# Patient Record
Sex: Female | Born: 1935 | Race: White | Hispanic: No | Marital: Single
Health system: Southern US, Community
[De-identification: ages and names within clinical notes are randomized; demographics above are authoritative.]

---

## 1998-05-10 ENCOUNTER — Encounter
Admission: RE | Admit: 1998-05-10 | Discharge: 1998-05-16 | Payer: Self-pay | Admitting: Thoracic Surgery (Cardiothoracic Vascular Surgery)

## 1998-05-16 ENCOUNTER — Inpatient Hospital Stay (HOSPITAL_COMMUNITY)
Admission: RE | Admit: 1998-05-16 | Discharge: 1998-05-25 | Payer: Self-pay | Admitting: Thoracic Surgery (Cardiothoracic Vascular Surgery)

## 1998-05-29 ENCOUNTER — Encounter: Admission: RE | Admit: 1998-05-29 | Discharge: 1998-08-27 | Payer: Self-pay | Admitting: Internal Medicine

## 1998-07-12 ENCOUNTER — Inpatient Hospital Stay (HOSPITAL_COMMUNITY): Admission: EM | Admit: 1998-07-12 | Discharge: 1998-07-21 | Payer: Self-pay | Admitting: Emergency Medicine

## 1998-07-21 ENCOUNTER — Inpatient Hospital Stay: Admission: RE | Admit: 1998-07-21 | Discharge: 1998-08-07 | Payer: Self-pay | Admitting: Infectious Diseases

## 1998-08-30 ENCOUNTER — Inpatient Hospital Stay (HOSPITAL_COMMUNITY)
Admission: EM | Admit: 1998-08-30 | Discharge: 1998-09-04 | Payer: Self-pay | Admitting: Thoracic Surgery (Cardiothoracic Vascular Surgery)

## 1998-12-11 ENCOUNTER — Ambulatory Visit (HOSPITAL_COMMUNITY)
Admission: RE | Admit: 1998-12-11 | Discharge: 1998-12-11 | Payer: Self-pay | Admitting: Thoracic Surgery (Cardiothoracic Vascular Surgery)

## 1998-12-11 ENCOUNTER — Encounter: Payer: Self-pay | Admitting: Thoracic Surgery (Cardiothoracic Vascular Surgery)

## 1999-04-19 ENCOUNTER — Encounter: Payer: Self-pay | Admitting: Orthopedic Surgery

## 1999-04-19 ENCOUNTER — Ambulatory Visit (HOSPITAL_COMMUNITY): Admission: RE | Admit: 1999-04-19 | Discharge: 1999-04-19 | Payer: Self-pay | Admitting: Orthopedic Surgery

## 1999-06-18 ENCOUNTER — Inpatient Hospital Stay (HOSPITAL_COMMUNITY): Admission: EM | Admit: 1999-06-18 | Discharge: 1999-06-21 | Payer: Self-pay | Admitting: Emergency Medicine

## 1999-06-18 ENCOUNTER — Encounter: Payer: Self-pay | Admitting: Emergency Medicine

## 1999-06-21 ENCOUNTER — Encounter: Payer: Self-pay | Admitting: Internal Medicine

## 1999-07-10 ENCOUNTER — Ambulatory Visit (HOSPITAL_COMMUNITY): Admission: RE | Admit: 1999-07-10 | Discharge: 1999-07-10 | Payer: Self-pay | Admitting: Endocrinology

## 1999-07-10 ENCOUNTER — Encounter: Payer: Self-pay | Admitting: Endocrinology

## 2000-02-11 ENCOUNTER — Ambulatory Visit (HOSPITAL_COMMUNITY): Admission: RE | Admit: 2000-02-11 | Discharge: 2000-02-11 | Payer: Self-pay | Admitting: Endocrinology

## 2000-02-11 ENCOUNTER — Encounter: Payer: Self-pay | Admitting: Endocrinology

## 2000-03-26 ENCOUNTER — Encounter: Payer: Self-pay | Admitting: Internal Medicine

## 2000-03-26 ENCOUNTER — Ambulatory Visit (HOSPITAL_COMMUNITY): Admission: RE | Admit: 2000-03-26 | Discharge: 2000-03-26 | Payer: Self-pay | Admitting: Internal Medicine

## 2000-05-22 ENCOUNTER — Encounter: Payer: Self-pay | Admitting: Endocrinology

## 2000-05-22 ENCOUNTER — Ambulatory Visit (HOSPITAL_COMMUNITY): Admission: RE | Admit: 2000-05-22 | Discharge: 2000-05-22 | Payer: Self-pay | Admitting: Endocrinology

## 2000-10-02 ENCOUNTER — Encounter: Payer: Self-pay | Admitting: Endocrinology

## 2000-10-02 ENCOUNTER — Ambulatory Visit (HOSPITAL_COMMUNITY): Admission: RE | Admit: 2000-10-02 | Discharge: 2000-10-02 | Payer: Self-pay | Admitting: Endocrinology

## 2001-04-09 ENCOUNTER — Emergency Department (HOSPITAL_COMMUNITY): Admission: EM | Admit: 2001-04-09 | Discharge: 2001-04-09 | Payer: Self-pay

## 2002-01-22 ENCOUNTER — Other Ambulatory Visit: Admission: RE | Admit: 2002-01-22 | Discharge: 2002-01-22 | Payer: Self-pay | Admitting: Obstetrics and Gynecology

## 2002-03-04 ENCOUNTER — Ambulatory Visit (HOSPITAL_COMMUNITY): Admission: RE | Admit: 2002-03-04 | Discharge: 2002-03-04 | Payer: Self-pay | Admitting: Obstetrics and Gynecology

## 2002-03-04 ENCOUNTER — Encounter (INDEPENDENT_AMBULATORY_CARE_PROVIDER_SITE_OTHER): Payer: Self-pay

## 2002-08-04 ENCOUNTER — Encounter (INDEPENDENT_AMBULATORY_CARE_PROVIDER_SITE_OTHER): Payer: Self-pay | Admitting: *Deleted

## 2002-08-04 ENCOUNTER — Ambulatory Visit (HOSPITAL_COMMUNITY): Admission: RE | Admit: 2002-08-04 | Discharge: 2002-08-04 | Payer: Self-pay | Admitting: Gastroenterology

## 2003-01-21 ENCOUNTER — Encounter: Admission: RE | Admit: 2003-01-21 | Discharge: 2003-01-21 | Payer: Self-pay | Admitting: Endocrinology

## 2003-01-21 ENCOUNTER — Encounter: Payer: Self-pay | Admitting: Endocrinology

## 2003-03-03 ENCOUNTER — Encounter: Admission: RE | Admit: 2003-03-03 | Discharge: 2003-03-03 | Payer: Self-pay | Admitting: Endocrinology

## 2003-03-03 ENCOUNTER — Encounter: Payer: Self-pay | Admitting: Endocrinology

## 2003-04-22 ENCOUNTER — Ambulatory Visit (HOSPITAL_COMMUNITY): Admission: RE | Admit: 2003-04-22 | Discharge: 2003-04-22 | Payer: Self-pay | Admitting: Endocrinology

## 2003-04-22 ENCOUNTER — Encounter: Payer: Self-pay | Admitting: Endocrinology

## 2003-04-22 ENCOUNTER — Encounter: Admission: RE | Admit: 2003-04-22 | Discharge: 2003-04-22 | Payer: Self-pay | Admitting: Endocrinology

## 2003-07-28 ENCOUNTER — Encounter: Payer: Self-pay | Admitting: Internal Medicine

## 2003-07-28 ENCOUNTER — Ambulatory Visit (HOSPITAL_COMMUNITY): Admission: RE | Admit: 2003-07-28 | Discharge: 2003-07-28 | Payer: Self-pay | Admitting: Internal Medicine

## 2003-10-31 ENCOUNTER — Inpatient Hospital Stay (HOSPITAL_COMMUNITY): Admission: AD | Admit: 2003-10-31 | Discharge: 2003-11-02 | Payer: Self-pay | Admitting: Endocrinology

## 2004-01-09 ENCOUNTER — Ambulatory Visit (HOSPITAL_COMMUNITY): Admission: RE | Admit: 2004-01-09 | Discharge: 2004-01-09 | Payer: Self-pay | Admitting: Gastroenterology

## 2004-02-28 ENCOUNTER — Other Ambulatory Visit: Admission: RE | Admit: 2004-02-28 | Discharge: 2004-02-28 | Payer: Self-pay | Admitting: Obstetrics and Gynecology

## 2004-10-23 ENCOUNTER — Ambulatory Visit: Payer: Self-pay | Admitting: Endocrinology

## 2005-01-14 ENCOUNTER — Ambulatory Visit: Payer: Self-pay | Admitting: Endocrinology

## 2005-01-22 ENCOUNTER — Ambulatory Visit (HOSPITAL_COMMUNITY): Admission: RE | Admit: 2005-01-22 | Discharge: 2005-01-22 | Payer: Self-pay | Admitting: Endocrinology

## 2005-02-19 ENCOUNTER — Ambulatory Visit: Payer: Self-pay | Admitting: Endocrinology

## 2005-04-03 ENCOUNTER — Ambulatory Visit: Payer: Self-pay | Admitting: Endocrinology

## 2005-04-12 ENCOUNTER — Ambulatory Visit: Payer: Self-pay | Admitting: Endocrinology

## 2005-04-15 ENCOUNTER — Ambulatory Visit: Payer: Self-pay | Admitting: Endocrinology

## 2005-04-19 ENCOUNTER — Ambulatory Visit: Payer: Self-pay | Admitting: Internal Medicine

## 2005-04-26 ENCOUNTER — Ambulatory Visit: Payer: Self-pay | Admitting: Endocrinology

## 2005-04-26 ENCOUNTER — Ambulatory Visit (HOSPITAL_COMMUNITY): Admission: RE | Admit: 2005-04-26 | Discharge: 2005-04-26 | Payer: Self-pay | Admitting: Endocrinology

## 2005-04-30 ENCOUNTER — Ambulatory Visit: Payer: Self-pay | Admitting: Endocrinology

## 2005-05-15 ENCOUNTER — Ambulatory Visit: Payer: Self-pay | Admitting: Endocrinology

## 2005-06-10 ENCOUNTER — Ambulatory Visit: Payer: Self-pay | Admitting: Endocrinology

## 2005-06-21 ENCOUNTER — Encounter: Admission: RE | Admit: 2005-06-21 | Discharge: 2005-06-21 | Payer: Self-pay | Admitting: Orthopedic Surgery

## 2005-07-03 ENCOUNTER — Ambulatory Visit: Payer: Self-pay | Admitting: Endocrinology

## 2005-07-10 ENCOUNTER — Ambulatory Visit: Payer: Self-pay | Admitting: Endocrinology

## 2005-07-16 ENCOUNTER — Ambulatory Visit (HOSPITAL_COMMUNITY): Admission: RE | Admit: 2005-07-16 | Discharge: 2005-07-16 | Payer: Self-pay | Admitting: Endocrinology

## 2005-07-24 ENCOUNTER — Ambulatory Visit: Payer: Self-pay | Admitting: Internal Medicine

## 2005-09-03 ENCOUNTER — Ambulatory Visit: Payer: Self-pay | Admitting: Internal Medicine

## 2005-11-27 ENCOUNTER — Ambulatory Visit: Payer: Self-pay | Admitting: Internal Medicine

## 2006-02-20 ENCOUNTER — Ambulatory Visit: Payer: Self-pay | Admitting: Internal Medicine

## 2006-03-20 ENCOUNTER — Ambulatory Visit: Payer: Self-pay | Admitting: Internal Medicine

## 2006-03-26 ENCOUNTER — Ambulatory Visit: Payer: Self-pay | Admitting: Endocrinology

## 2006-04-02 ENCOUNTER — Ambulatory Visit: Payer: Self-pay | Admitting: Endocrinology

## 2006-04-15 ENCOUNTER — Ambulatory Visit: Payer: Self-pay | Admitting: Internal Medicine

## 2006-05-06 ENCOUNTER — Ambulatory Visit: Payer: Self-pay | Admitting: Endocrinology

## 2006-05-07 ENCOUNTER — Ambulatory Visit: Payer: Self-pay | Admitting: Endocrinology

## 2006-06-06 ENCOUNTER — Encounter: Payer: Self-pay | Admitting: Cardiology

## 2006-06-06 ENCOUNTER — Ambulatory Visit: Payer: Self-pay

## 2006-06-20 ENCOUNTER — Ambulatory Visit: Payer: Self-pay | Admitting: Internal Medicine

## 2006-07-02 ENCOUNTER — Ambulatory Visit: Payer: Self-pay | Admitting: Endocrinology

## 2006-07-18 ENCOUNTER — Inpatient Hospital Stay (HOSPITAL_COMMUNITY): Admission: EM | Admit: 2006-07-18 | Discharge: 2006-08-05 | Payer: Self-pay | Admitting: Emergency Medicine

## 2006-07-18 ENCOUNTER — Ambulatory Visit: Payer: Self-pay | Admitting: Internal Medicine

## 2006-07-18 ENCOUNTER — Encounter: Payer: Self-pay | Admitting: Internal Medicine

## 2006-08-09 ENCOUNTER — Inpatient Hospital Stay (HOSPITAL_COMMUNITY): Admission: EM | Admit: 2006-08-09 | Discharge: 2006-08-16 | Payer: Self-pay | Admitting: Emergency Medicine

## 2006-08-09 ENCOUNTER — Ambulatory Visit: Payer: Self-pay | Admitting: Internal Medicine

## 2006-08-26 ENCOUNTER — Ambulatory Visit: Payer: Self-pay | Admitting: Cardiovascular Disease

## 2006-09-08 ENCOUNTER — Ambulatory Visit: Payer: Self-pay | Admitting: Cardiology

## 2006-09-23 ENCOUNTER — Ambulatory Visit: Payer: Self-pay | Admitting: Cardiology

## 2006-09-24 ENCOUNTER — Ambulatory Visit: Payer: Self-pay | Admitting: Endocrinology

## 2006-10-01 ENCOUNTER — Ambulatory Visit: Payer: Self-pay | Admitting: Endocrinology

## 2006-10-23 ENCOUNTER — Ambulatory Visit: Payer: Self-pay | Admitting: Cardiology

## 2006-11-25 ENCOUNTER — Ambulatory Visit: Payer: Self-pay | Admitting: Cardiology

## 2006-12-29 ENCOUNTER — Ambulatory Visit: Payer: Self-pay | Admitting: Cardiology

## 2006-12-30 ENCOUNTER — Ambulatory Visit: Payer: Self-pay | Admitting: Endocrinology

## 2006-12-30 ENCOUNTER — Ambulatory Visit (HOSPITAL_COMMUNITY): Admission: RE | Admit: 2006-12-30 | Discharge: 2006-12-30 | Payer: Self-pay | Admitting: Endocrinology

## 2007-01-09 ENCOUNTER — Ambulatory Visit: Payer: Self-pay | Admitting: Endocrinology

## 2007-01-09 LAB — CONVERTED CEMR LAB: Calcium, Total (PTH): 8.9 mg/dL (ref 8.4–10.5)

## 2007-01-14 ENCOUNTER — Ambulatory Visit: Payer: Self-pay | Admitting: Endocrinology

## 2007-01-20 ENCOUNTER — Ambulatory Visit: Payer: Self-pay | Admitting: Internal Medicine

## 2007-02-04 ENCOUNTER — Ambulatory Visit: Payer: Self-pay | Admitting: Endocrinology

## 2007-02-11 ENCOUNTER — Ambulatory Visit: Payer: Self-pay | Admitting: Internal Medicine

## 2007-02-12 ENCOUNTER — Ambulatory Visit: Payer: Self-pay | Admitting: Endocrinology

## 2007-02-24 ENCOUNTER — Ambulatory Visit: Payer: Self-pay | Admitting: Cardiology

## 2007-03-17 ENCOUNTER — Ambulatory Visit: Payer: Self-pay | Admitting: Endocrinology

## 2007-05-06 ENCOUNTER — Ambulatory Visit: Payer: Self-pay | Admitting: Endocrinology

## 2007-05-06 LAB — CONVERTED CEMR LAB
Basophils Relative: 0.1 % (ref 0.0–1.0)
CO2: 34 meq/L — ABNORMAL HIGH (ref 19–32)
Calcium, Total (PTH): 8.5 mg/dL (ref 8.4–10.5)
Calcium: 8.7 mg/dL (ref 8.4–10.5)
Chloride: 105 meq/L (ref 96–112)
Eosinophils Relative: 4.3 % (ref 0.0–5.0)
Glucose, Bld: 93 mg/dL (ref 70–99)
HCT: 35.3 % — ABNORMAL LOW (ref 36.0–46.0)
Lymphocytes Relative: 15.8 % (ref 12.0–46.0)
Neutro Abs: 4.4 10*3/uL (ref 1.4–7.7)
Neutrophils Relative %: 66.7 % (ref 43.0–77.0)
PTH: 115.2 pg/mL — ABNORMAL HIGH (ref 14.0–72.0)
Platelets: 305 10*3/uL (ref 150–400)
RBC: 4.05 M/uL (ref 3.87–5.11)
Sodium: 141 meq/L (ref 135–145)
WBC: 6.7 10*3/uL (ref 4.5–10.5)

## 2007-05-27 ENCOUNTER — Ambulatory Visit: Payer: Self-pay | Admitting: Internal Medicine

## 2007-05-27 ENCOUNTER — Ambulatory Visit: Payer: Self-pay | Admitting: Cardiology

## 2007-05-27 ENCOUNTER — Inpatient Hospital Stay (HOSPITAL_COMMUNITY): Admission: EM | Admit: 2007-05-27 | Discharge: 2007-06-04 | Payer: Self-pay | Admitting: Orthopedic Surgery

## 2007-05-27 LAB — CONVERTED CEMR LAB
Calcium: 8.8 mg/dL (ref 8.4–10.5)
Chloride: 101 meq/L (ref 96–112)
GFR calc non Af Amer: 38 mL/min

## 2007-06-01 ENCOUNTER — Ambulatory Visit: Payer: Self-pay | Admitting: Physical Medicine & Rehabilitation

## 2007-07-14 ENCOUNTER — Encounter (HOSPITAL_COMMUNITY): Payer: Self-pay | Admitting: *Deleted

## 2007-07-14 DIAGNOSIS — I2510 Atherosclerotic heart disease of native coronary artery without angina pectoris: Secondary | ICD-10-CM | POA: Insufficient documentation

## 2007-07-14 DIAGNOSIS — I10 Essential (primary) hypertension: Secondary | ICD-10-CM | POA: Insufficient documentation

## 2007-07-14 DIAGNOSIS — E785 Hyperlipidemia, unspecified: Secondary | ICD-10-CM

## 2007-07-14 DIAGNOSIS — N259 Disorder resulting from impaired renal tubular function, unspecified: Secondary | ICD-10-CM | POA: Insufficient documentation

## 2007-08-17 ENCOUNTER — Ambulatory Visit (HOSPITAL_COMMUNITY): Admission: RE | Admit: 2007-08-17 | Discharge: 2007-08-17 | Payer: Self-pay

## 2007-08-20 ENCOUNTER — Inpatient Hospital Stay (HOSPITAL_COMMUNITY): Admission: AD | Admit: 2007-08-20 | Discharge: 2007-08-27 | Payer: Self-pay | Admitting: Endocrinology

## 2007-08-20 ENCOUNTER — Ambulatory Visit: Payer: Self-pay | Admitting: Endocrinology

## 2007-08-20 ENCOUNTER — Ambulatory Visit: Payer: Self-pay

## 2007-08-30 ENCOUNTER — Emergency Department (HOSPITAL_COMMUNITY): Admission: EM | Admit: 2007-08-30 | Discharge: 2007-08-31 | Payer: Self-pay | Admitting: Emergency Medicine

## 2007-09-02 ENCOUNTER — Ambulatory Visit: Payer: Self-pay | Admitting: Endocrinology

## 2007-09-02 ENCOUNTER — Ambulatory Visit: Payer: Self-pay | Admitting: Cardiology

## 2007-09-02 LAB — CONVERTED CEMR LAB
INR: 1.6 — ABNORMAL HIGH (ref 0.8–1.0)
Prothrombin Time: 15.8 s — ABNORMAL HIGH (ref 10.9–13.3)

## 2007-10-01 ENCOUNTER — Ambulatory Visit: Payer: Self-pay | Admitting: Endocrinology

## 2007-10-19 ENCOUNTER — Telehealth (INDEPENDENT_AMBULATORY_CARE_PROVIDER_SITE_OTHER): Payer: Self-pay | Admitting: *Deleted

## 2007-10-20 ENCOUNTER — Ambulatory Visit: Payer: Self-pay | Admitting: Endocrinology

## 2007-10-23 ENCOUNTER — Ambulatory Visit: Payer: Self-pay | Admitting: Cardiology

## 2007-10-30 ENCOUNTER — Ambulatory Visit: Payer: Self-pay | Admitting: Cardiology

## 2007-11-02 ENCOUNTER — Encounter: Payer: Self-pay | Admitting: Endocrinology

## 2007-11-04 ENCOUNTER — Emergency Department (HOSPITAL_COMMUNITY): Admission: EM | Admit: 2007-11-04 | Discharge: 2007-11-04 | Payer: Self-pay | Admitting: Emergency Medicine

## 2007-11-06 ENCOUNTER — Ambulatory Visit: Payer: Self-pay | Admitting: Cardiology

## 2007-11-17 ENCOUNTER — Ambulatory Visit: Payer: Self-pay | Admitting: Internal Medicine

## 2007-12-01 ENCOUNTER — Ambulatory Visit: Payer: Self-pay | Admitting: Cardiology

## 2007-12-14 ENCOUNTER — Ambulatory Visit: Payer: Self-pay | Admitting: Internal Medicine

## 2007-12-14 ENCOUNTER — Ambulatory Visit: Payer: Self-pay | Admitting: Cardiology

## 2007-12-14 ENCOUNTER — Inpatient Hospital Stay (HOSPITAL_COMMUNITY): Admission: EM | Admit: 2007-12-14 | Discharge: 2007-12-29 | Payer: Self-pay | Admitting: Emergency Medicine

## 2008-01-14 ENCOUNTER — Ambulatory Visit: Payer: Self-pay | Admitting: Internal Medicine

## 2008-01-14 ENCOUNTER — Inpatient Hospital Stay (HOSPITAL_COMMUNITY): Admission: EM | Admit: 2008-01-14 | Discharge: 2008-01-31 | Payer: Self-pay | Admitting: Emergency Medicine

## 2008-01-15 ENCOUNTER — Ambulatory Visit: Payer: Self-pay | Admitting: Surgery

## 2008-01-15 ENCOUNTER — Encounter: Payer: Self-pay | Admitting: Internal Medicine

## 2008-01-31 ENCOUNTER — Encounter: Payer: Self-pay | Admitting: Endocrinology

## 2008-12-27 IMAGING — CR DG CHEST 2V
2 series · 2 of 2 positions shown · non-contrast
Comparison: 08/14/2006

CLINICAL DATA: Chest pain

CHEST - 2 VIEW:

[view not recorded (1 of 2)]
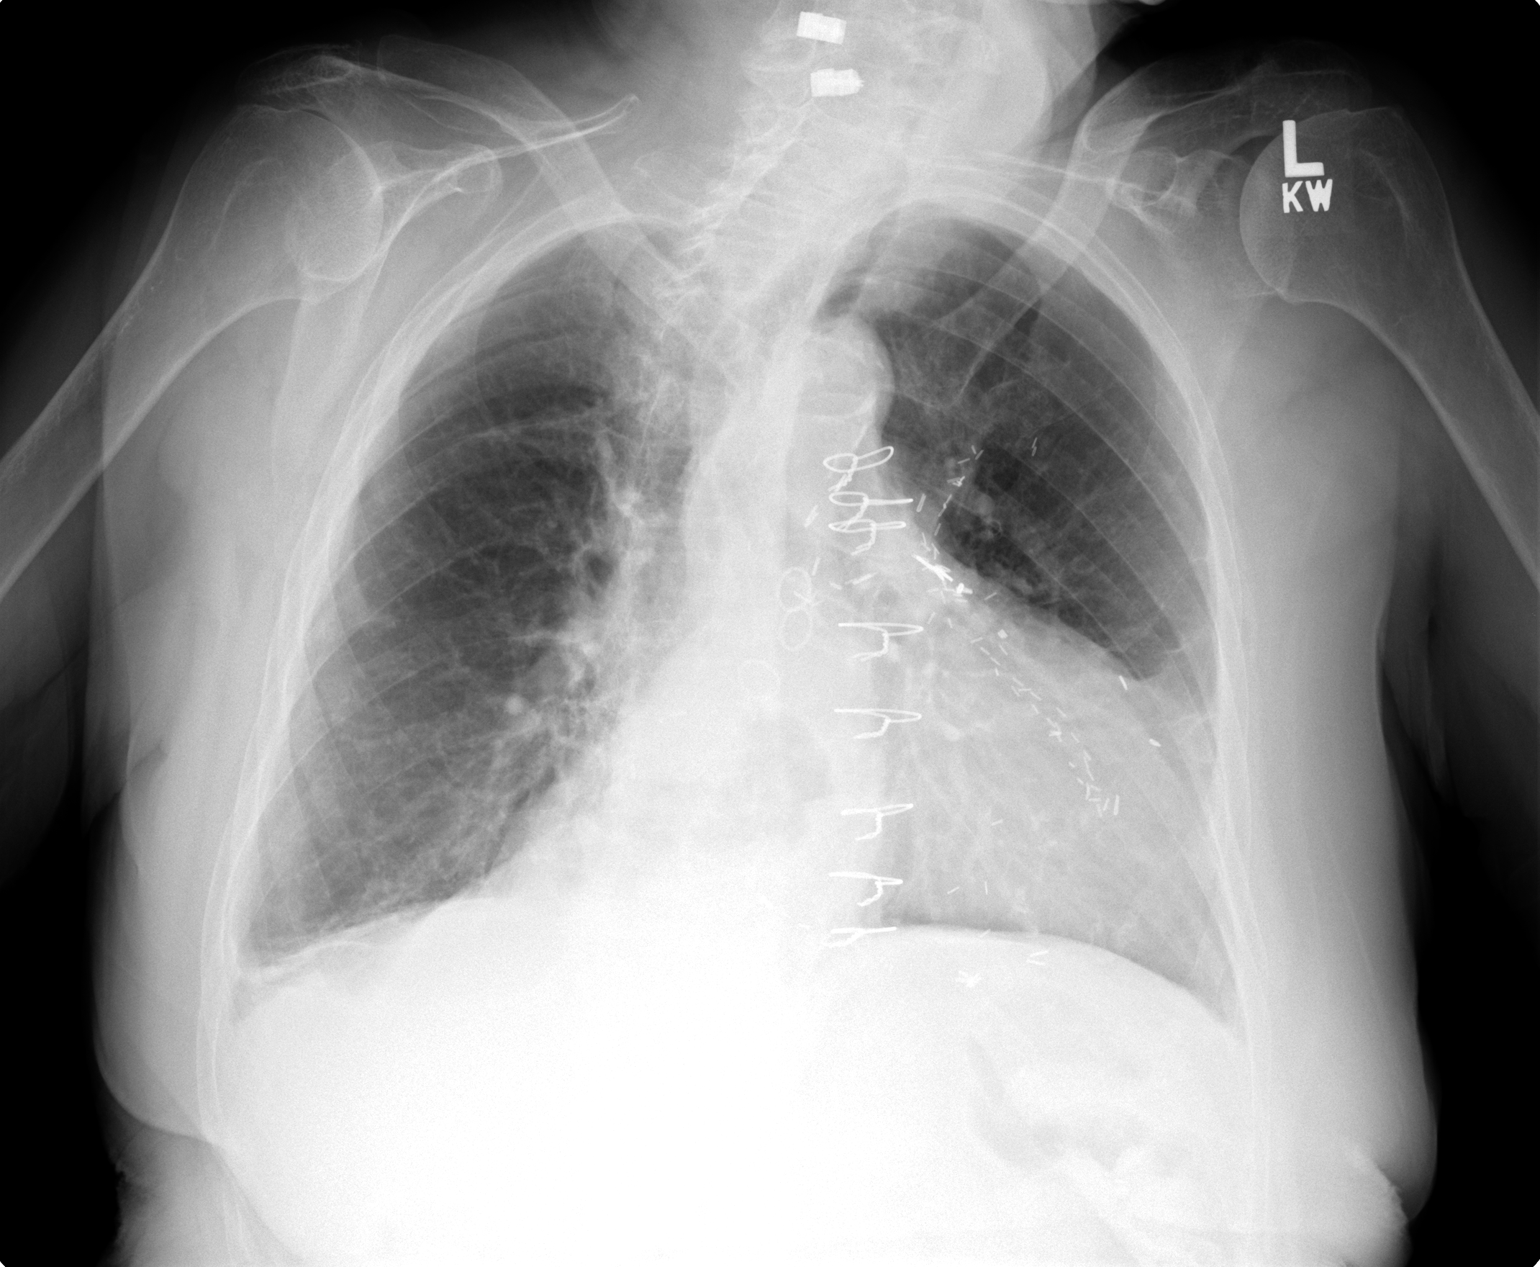

[view not recorded (2 of 2)]
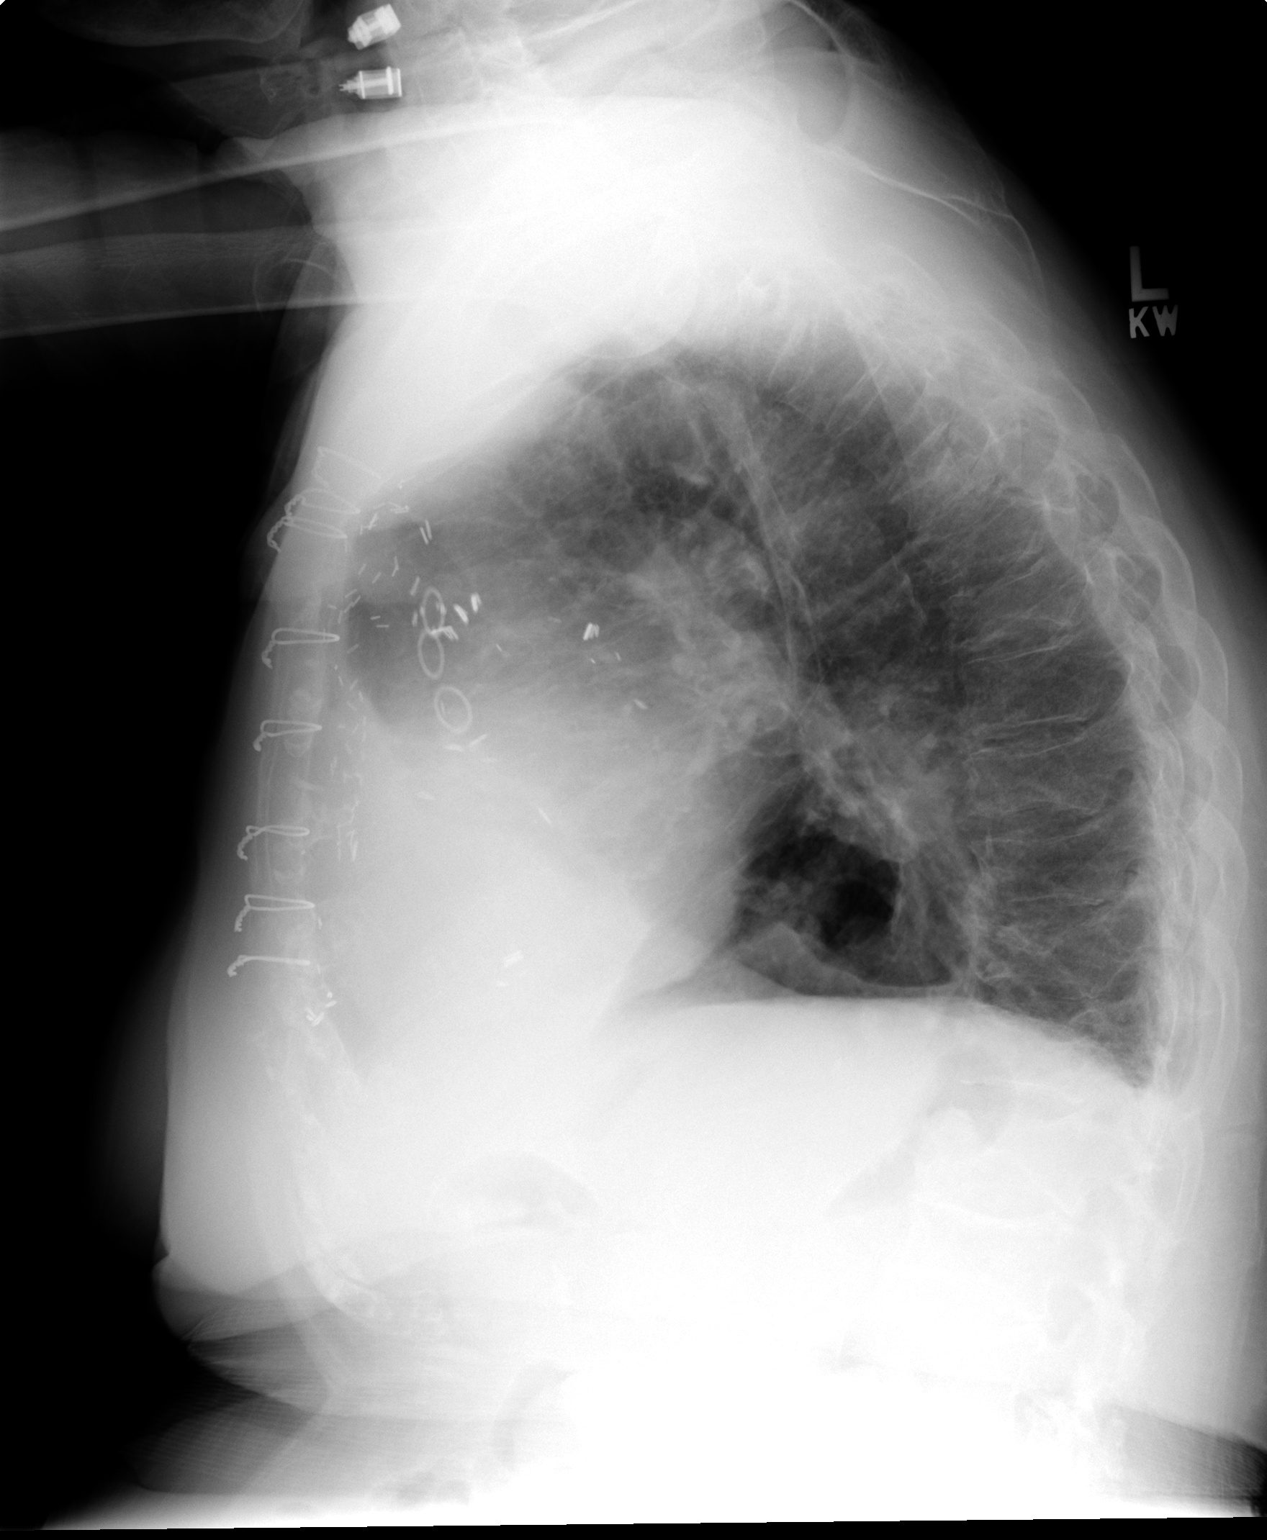

[2 of 2 positions shown; findings below may reference images not displayed]

FINDINGS: There is stable cardiomegaly. Large hiatal hernia present. Patient is
status post CABG.

Diffuse interstitial disease noted. Somewhat nodular density noted in the right
midlung, not seen on prior study.

There are mild compression fractures throughout the thoracic spine stable.
IMPRESSION: Question new right midlung nodule. This could be further evaluated with chest
CT.

Stable cardiomegaly.

Stable interstitial prominence, likely chronic interstitial lung disease.

Stable multiple thoracic spine compression fractures.

## 2009-09-24 IMAGING — CR DG CHEST 2V
2 series · 2 of 2 positions shown · non-contrast
Comparison: 08/20/07.

CLINICAL DATA: Congestion.  Cough.  
 CHEST - 2 VIEW:

[w chest pa]
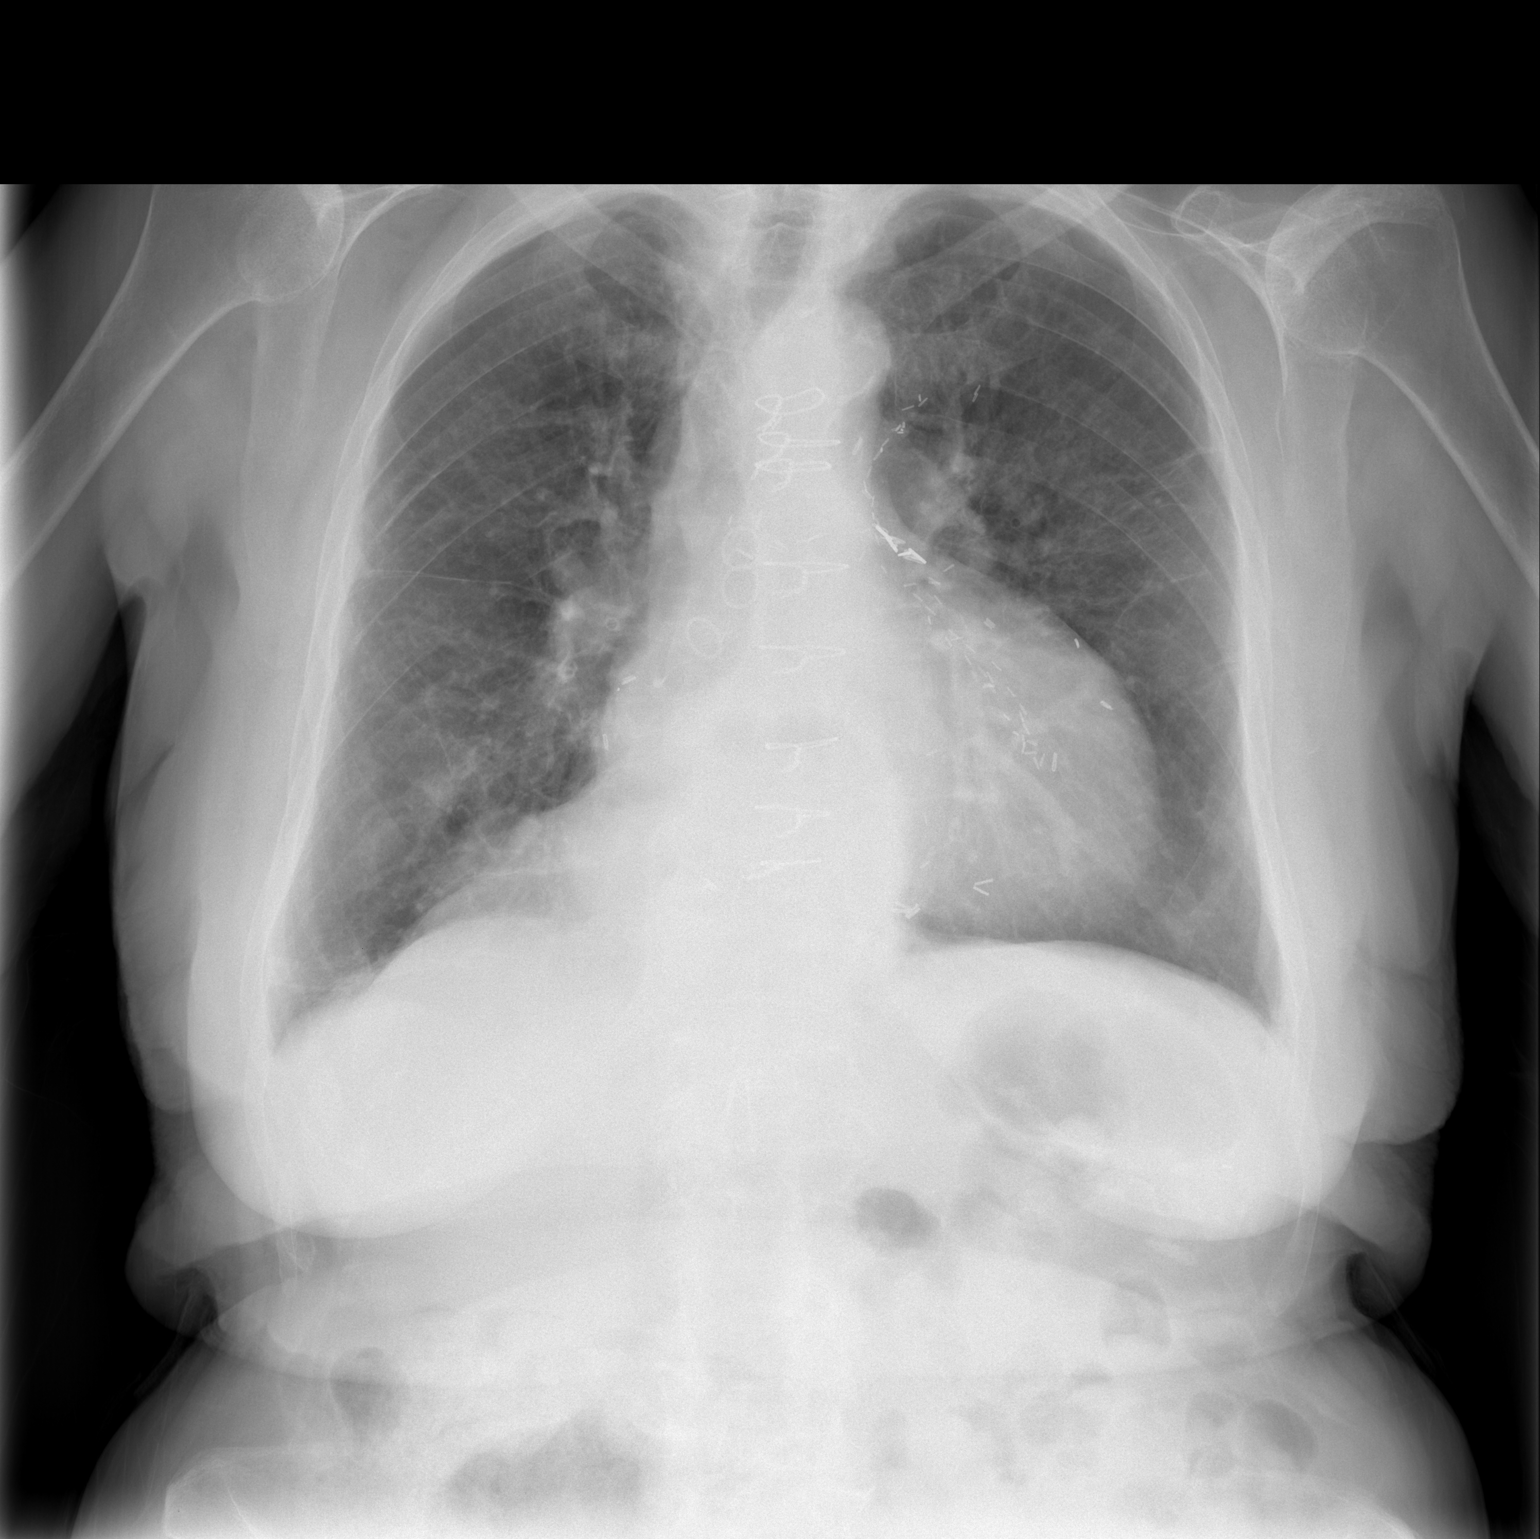

[w chest lat]
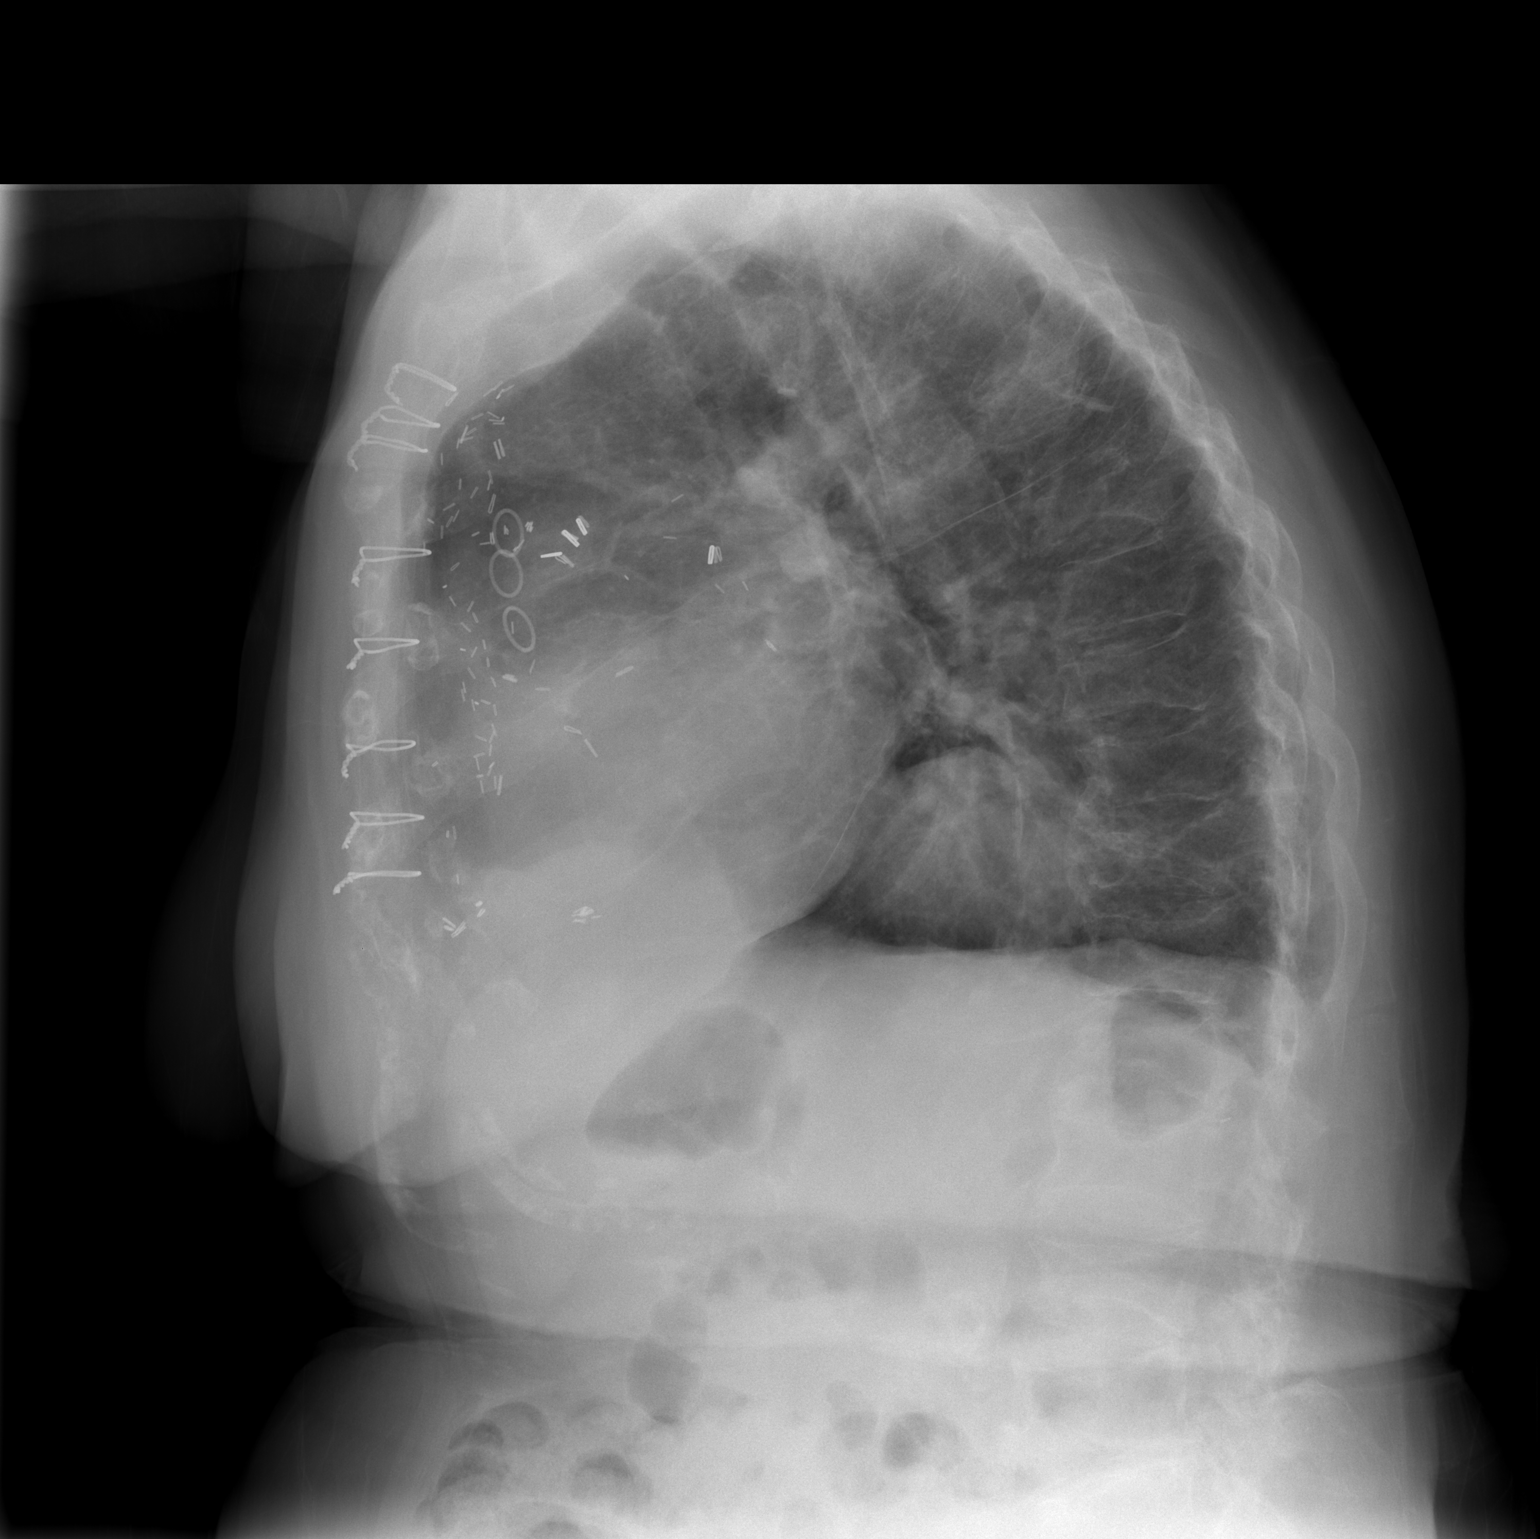

[2 of 2 positions shown; findings below may reference images not displayed]

FINDINGS: Mild cardiac enlargement is unchanged.  Scarring right lung base.  Negative for infiltrates or effusions.  Peribronchial cuffing is chronic.
IMPRESSION: Chronic pulmonary changes.   No acute infiltrates.

## 2009-09-29 IMAGING — CR DG CHEST 2V
2 series · 2 of 2 positions shown · non-contrast
Comparison: [HOSPITAL] portable chest x-ray 12/15/07 at 5398 hours and two view chest x-ray 12/13/07.

CLINICAL DATA: Cough, congestion.  History of COPD.  
DIAGNOSTIC CHEST ? 2 VIEW:

[w chest pa *]
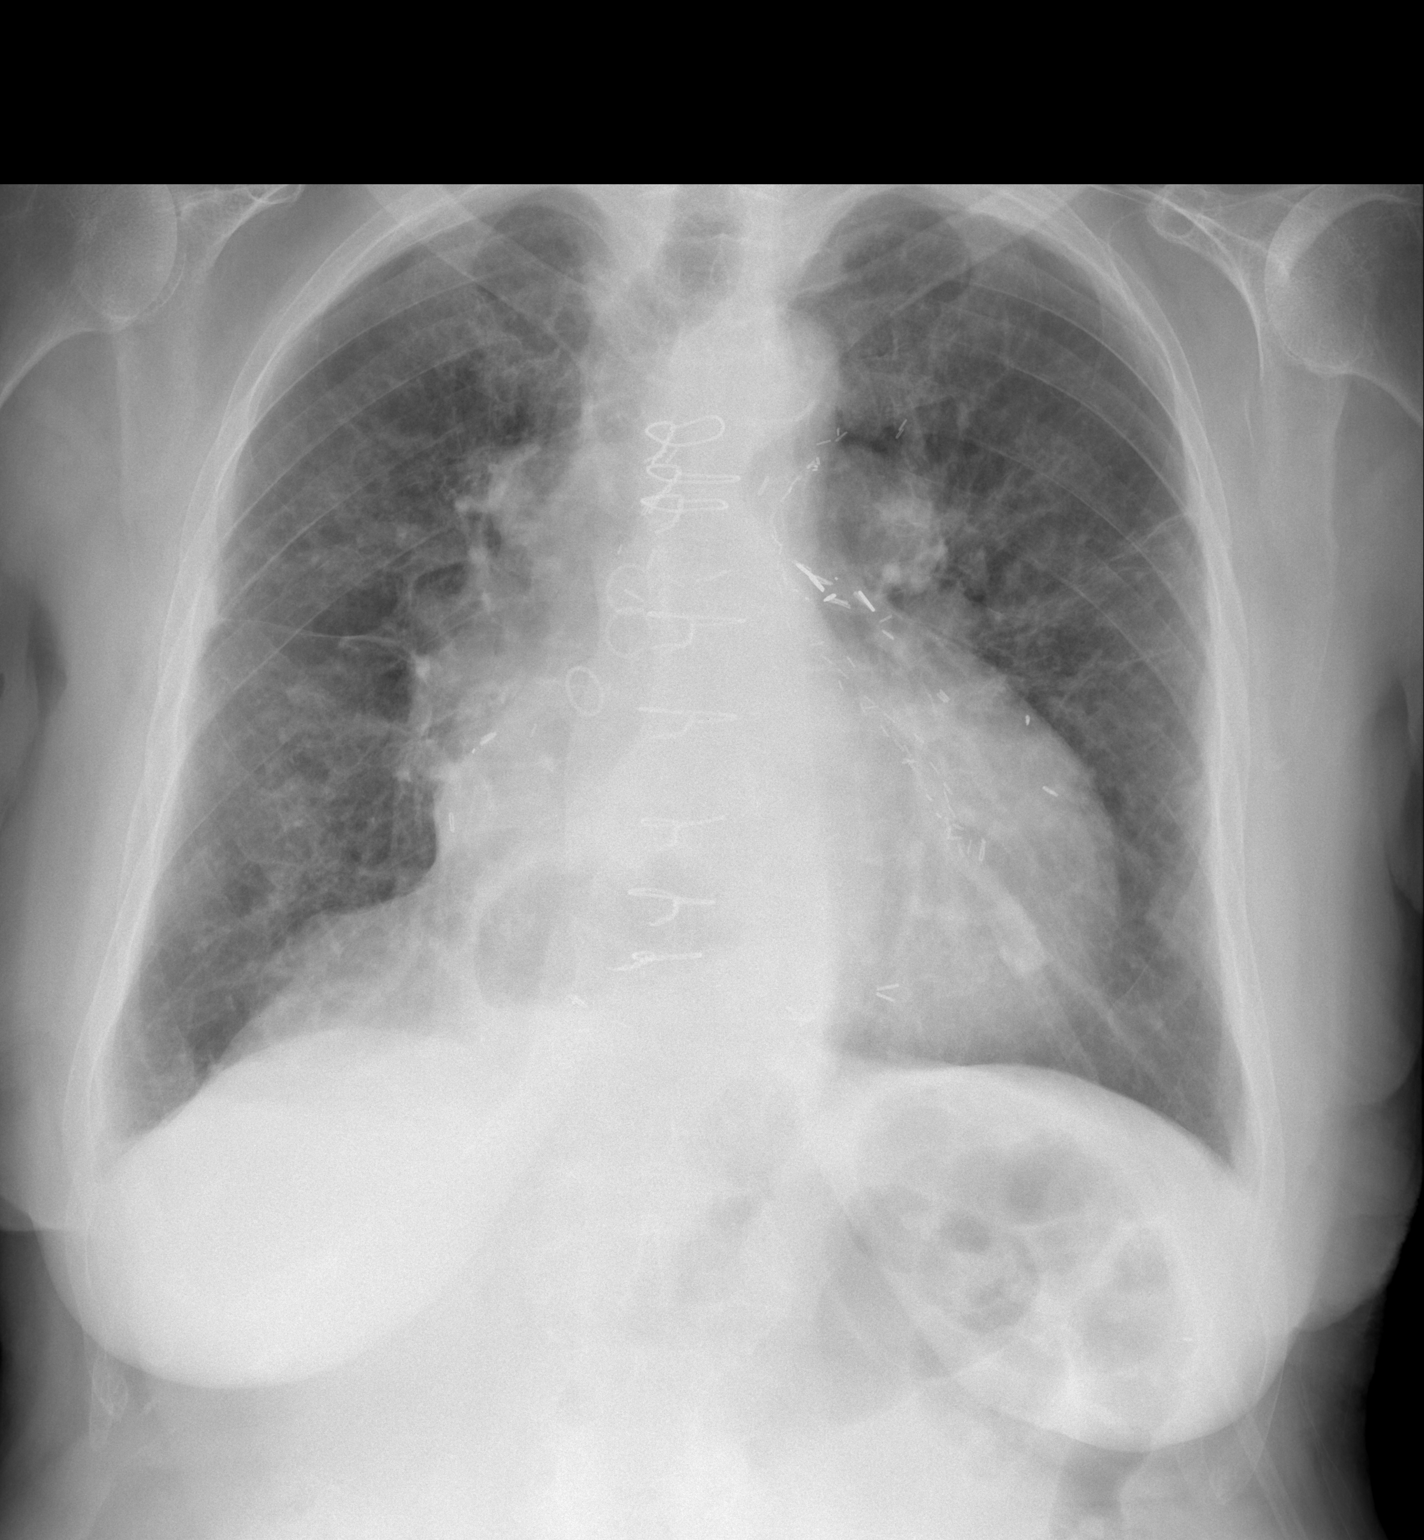

[w chest lat]
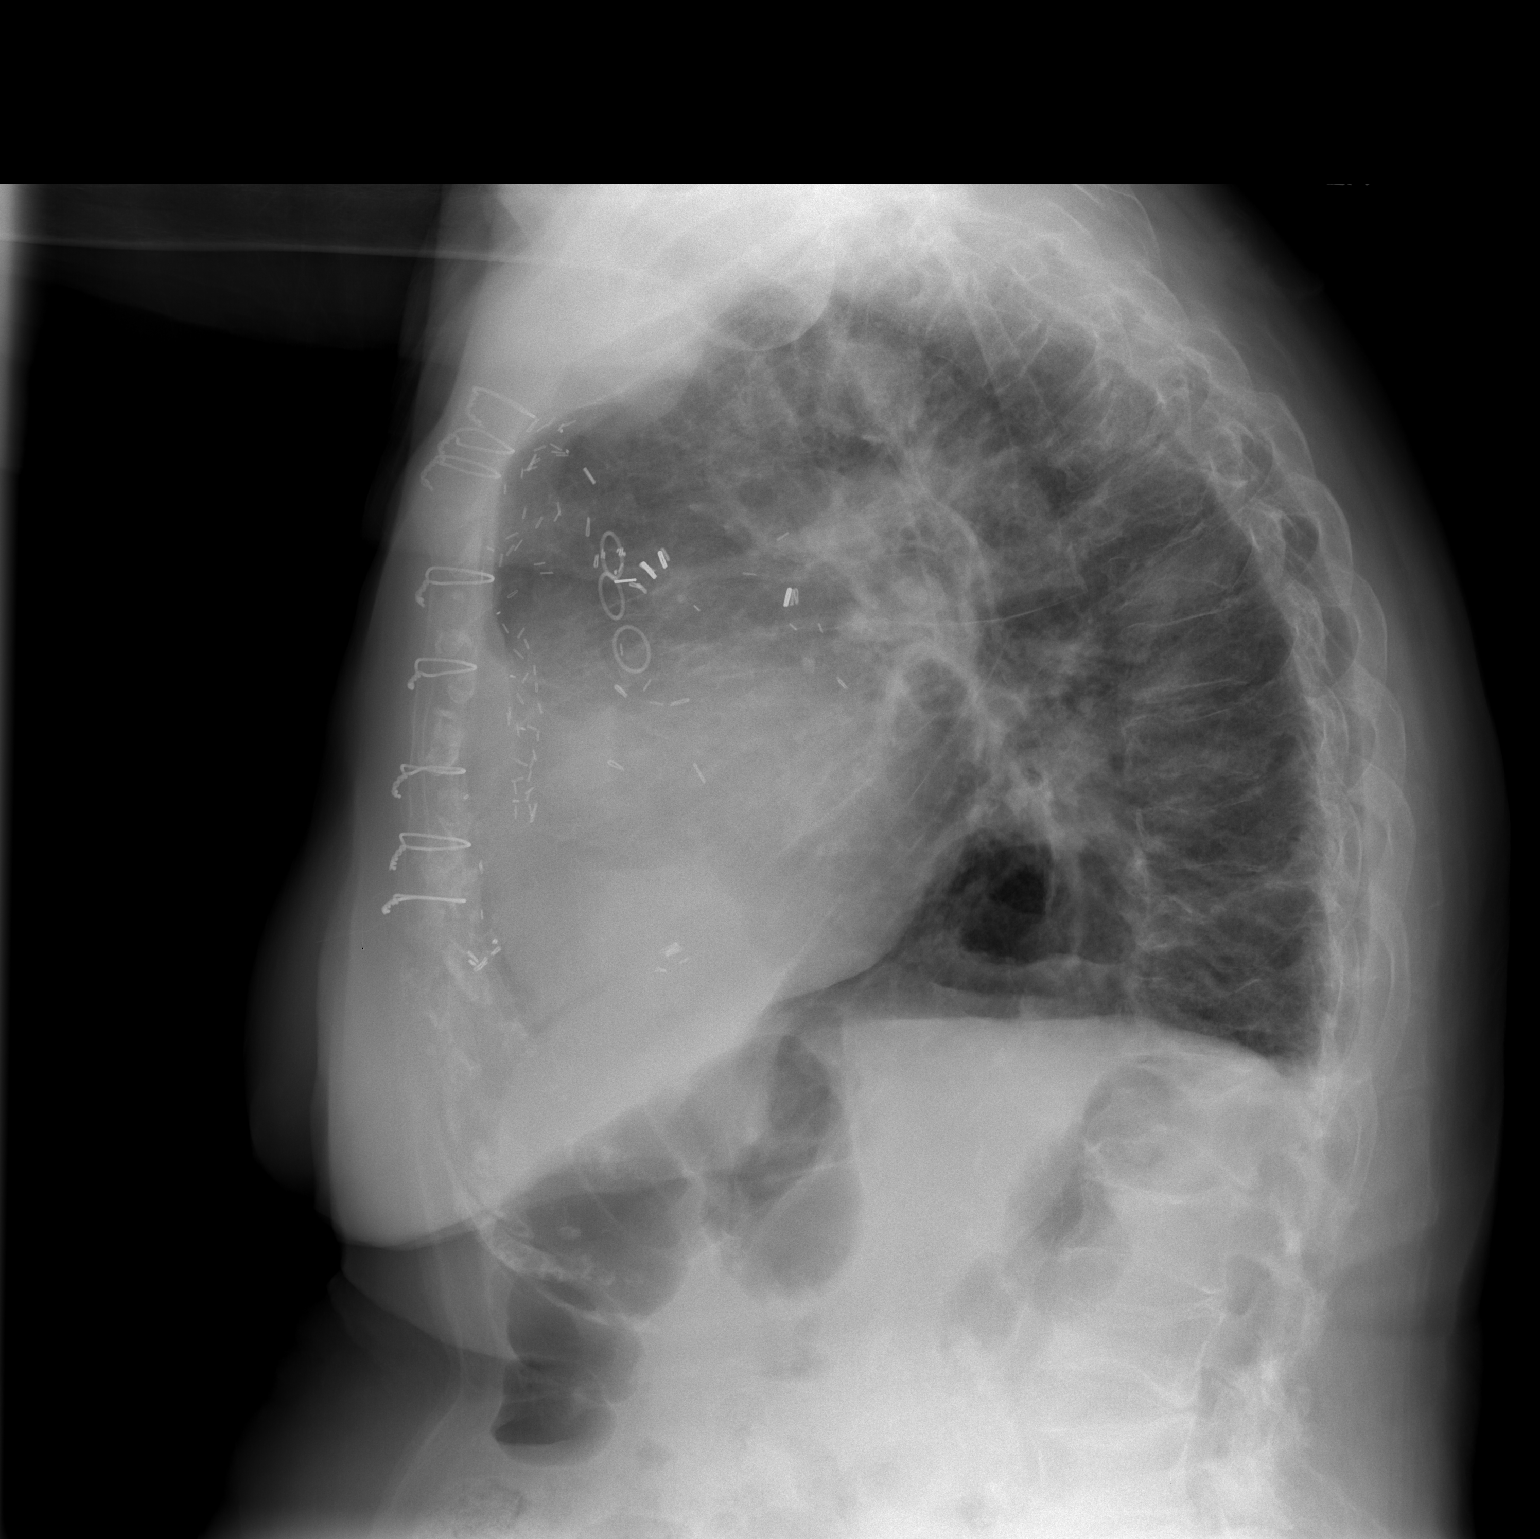

[2 of 2 positions shown; findings below may reference images not displayed]

FINDINGS: Essentially stable slight diffuse interstitial edema finding superimposed upon slight chronic interstitial lung disease is seen.  Stable cardiomegaly, post CABG change and hiatal hernia findings noted.  No new significant abnormality is seen with dorsal osteopenia and multiple thoracic and lumbar spine compression fracture deformities without retropulsion.
IMPRESSION: 1.  Essentially stable slight diffuse interstitial edema superimposed upon chronic interstitial lung disease. 
2.  Stable cardiomegaly, post CABG, moderate sized hiatus hernia.
3.  No new abnormality noted.

## 2011-01-13 ENCOUNTER — Encounter: Payer: Self-pay | Admitting: Orthopedic Surgery

## 2011-01-13 ENCOUNTER — Encounter (HOSPITAL_BASED_OUTPATIENT_CLINIC_OR_DEPARTMENT_OTHER): Payer: Self-pay | Admitting: Internal Medicine

## 2011-05-07 NOTE — H&P (Signed)
NAME:  Bethany Graham, Bethany Graham                ACCOUNT NO.:  [id number]   MEDICAL RECORD NO.:  [id number]          PATIENT TYPE:  INP   LOCATION:  1311                         FACILITY:  [facility]   PHYSICIAN:  Sabino Donovan, MD        DATE OF BIRTH:  February 05, 1916   DATE OF ADMISSION:  01/14/2008  DATE OF DISCHARGE:                              HISTORY & PHYSICAL   PRIMARY CARE PHYSICIAN:  Dr. Romero Belling.   CHIEF COMPLAINT:  Feeling bad.   PRESENT ILLNESS:  An 77 year old white female with history of COPD, CHF,  hypertension, and dyslipidemia, CAD with CABG in 1999 who presented with  a complaint of feeling bad.  She reports that she was doing fine until  the day prior to admission.  She woke up this morning and felt bad, felt  nauseated most of the day, and had small amount of nonbilious, nonbloody  vomiting.  She also reports subjective fever and chills, otherwise  denies any other symptoms.  No sick contacts.  No urinary symptoms  including dysuria, hesitancy, or foul-smelling urine.  She does report  pain in the right thigh and increased redness for the last few days.  She contributes this weakness to some sort of pill that the  inpatient  facility tried to give her otherwise did not provide any more specific  information.   PAST HISTORY:  1. COPD.  2. CHF.  3. Hypertension.  4. Hyperlipidemia.  5. CAD with CABG in 1999.   FAMILY HISTORY:  Noncontributory.   SOCIAL HISTORY:  She smoked in the past but quit in 1977.  No alcohol.  No IV drug use.   DRUG ALLERGIES:  SHE IS ALLERGIC TO:  1. PENICILLIN.  2. CODEINE.  3. CIPROFLOXACIN.  4. TETRACYCLINE.   MEDICATION:  1. Ambien.  2. Calcitriol 0.25 mg p.o. daily.  3. Zinc 20 mg p.o. daily.  4. Lasix 80 mg p.o. daily.  5. Colace 100 mg p.o. daily.  6. Nebulizers.  7. Tylenol p.r.n.  8. Mucinex 2 tablets p.o. b.i.d.  9. Vitamin C 500 mg p.o. b.i.d.  10.Ativan 1 mg p.o. every 6 hours p.r.n. agitation.  11.Protonix 40 mg p.o.  b.i.d.   REVIEW OF SYMPTOMS:  Negative review of symptoms except noted in HPI.   PHYSICAL EXAMINATION:  Temperature 98.7.  Pulse 87.  Respiratory rate  20.  Blood pressure 105/42.  O2 saturation 94% on 2 L nasal oxygen.  GENERAL:  No acute distress.  HEENT:  PERRLA.  EOMI.  NECK:  No lymphadenopathy.  No thyromegaly.  No JVD.  CHEST:  Clear to auscultation bilaterally.  HEART:  Regular rate and rhythm.  No murmur, rubs, or gallops.  ABDOMEN:  Soft, nontender, and nondistended.  Normoactive bowel sounds.  EXTREMITIES:  She has 1+ pitting edema in bilateral lower extremities up  to the mid shin on the right.  Right thigh had a large area of increased  erythema, tenderness to palpation, and warmth consistent with  cellulitis.   LABORATORY DATA:  White count 12.7, H&H 9.5 and 29.2, platelets 289,  sodium 144,  potassium 3.6, BUN 38, creatinine 1.86,.  Urinalysis  1.0019, leukocyte esterase large, white blood count 21-50, many  bacteria.  EKG, normal sinus rhythm and right bundle branch block,  otherwise no ST or T-wave changes.   IMPRESSION:  An 77 year old white female with a history of chronic  obstructive pulmonary disease, congestive heart failure, hypertension,  hyperlipidemia admitted for urinary tract infection and cellulitis.  1. Urinary tract infection seen by urinalysis otherwise denies any      clinical symptoms.  We will start patient on Bactrim as SHE IS      ALLERGIC TO OTHER MEDICATION.  2. Cellulitis.  Her clinical exam consistent with cellulitis of right      thigh.  We will start patient on clindamycin.  We will give IV      fluids and send blood cultures.  3. Chronic obstructive pulmonary disease.  Continue albuterol and      Atrovent nebulizer.  Currently, chest exam is unremarkable.  She      will hold off any prednisone.  4. Congestive heart failure.  She will continue her Lasix 80 mg p.o.      daily.  Unclear why she is not on beta-blocker or statins.  We will       check fasting lipids __________ consider starting beta-blocker and      statins give the mortality benefit.  5. Hyperlipidemia, check fasting lipids and consider restarting on      simvastatin.  6. Coronary artery disease.  Start patient on aspirin.      Sabino Donovan, MD  Electronically Signed     MJ/MEDQ  D:  01/14/2008  T:  01/15/2008  Job:  [id number]

## 2011-05-07 NOTE — Consult Note (Signed)
NAME:  Bethany Graham, Bethany Graham                ACCOUNT NO.:  [id number]   MEDICAL RECORD NO.:  [id number]          PATIENT TYPE:  INP   LOCATION:  1506                         FACILITY:  [facility]   PHYSICIAN:  Casimiro Needle B. Sherene Sires, MD, FCCPDATE OF BIRTH:  January 08, 1916   DATE OF CONSULTATION:  12/21/2007  DATE OF DISCHARGE:                                 CONSULTATION   REASON FOR CONSULTATION:  Refractory cough and dyspnea.   HISTORY:  An 89-year white female remote smoker with chronic dyspnea  with anything more than slow ADLs, who is been chronic oxygen-dependent  as well.  She was admitted for an apparent acute exacerbation, reporting  less than a week history of increasing cough and congestion with  thick, slightly discolored mucus and was initially felt to have a COPD  exacerbation versus CHF based on an elevated BNP.  However, cardiac  evaluation has now been completed and cardiology does not feel that her  dyspnea is due to any cardiac etiology.   The patient denies any pleuritic or exertional chest pain, orthopnea,  PND or increase in chronic mild leg swelling, fevers, chills, sweats.  She has not improved with diuretic therapy.   PAST MEDICAL HISTORY:  1. COPD, oxygen and nebulizer-dependent.  2. Ischemic heart disease status post CABG in 1999, with most recent      echocardiogram dated July 2007, ejection fraction 40%,  3. Diabetes.  4. Hypertension.  5. Hyperlipidemia.  6. Peripheral vascular disease.  7. Also, she has had brain aneurysm surgery.  8. Has gone downhill since she fractured her hip in June 2008 but      has been independent at home since September 2008.   MEDICATIONS:  At this point she is maintained on:  1. Ambien 5 mg q.h.s.  2. Atrovent 0.5 mg q.i.d.  3. Colace 50 mg q.h.s.  4. Coumadin.  5. Sliding scale insulin.  6. Mycelex Troches.  7. Prednisone taper.  8. Protonix b.i.d.  9. Robitussin 10 mL q.i.d.  10.Rocaltrol 0.25 mg daily.  11.Ventolin 2.5 mg q.i.d.   ALLERGIES:  Listed as PENICILLIN, TETRACYCLINE, CEPHALOSPORINS and  TRAMADOL.   SOCIAL HISTORY:  She quit smoking approximately 20 years ago.  She  denies any excess alcohol use.   FAMILY HISTORY:  Positive for coronary artery disease.  Negative for  atopy or respiratory diseases.   REVIEW OF SYSTEMS:  Taken in detail and negative except as outlined  above.   PHYSICAL EXAMINATION:  This is a frail, elderly white female who appears  in no acute distress.  She is afebrile with stable vital signs.  HEENT:  Unremarkable.  She has upper and lower dentures in place.  Oropharynx is clear.  NECK:  Supple without cervical adenopathy or tenderness.  Trachea is  midline.  No thyromegaly.  CHEST:  Hyperresonant to percussion with pan-expiratory wheeze, improved  with pursed-lip maneuver.  There is hyperresonance to percussion.  There is regular rate and rhythm with a 1/6 systolic murmur.  ABDOMEN:  Soft, benign, with no palpable organomegaly, masses or  tenderness.  EXTREMITIES:  Warm without calf tenderness,  cyanosis, clubbing or  significant edema.   No echocardiographic data is available.  Chest x-ray:  Not available to  the [facility] system but on December 26 is reported to show stable  cardiomegaly with mild increased interstitial markings consistent with  interstitial edema versus fibrosis.   Lab data significant for a bicarb level of 29, a BUN of 73 with a  creatinine 2.5.  Hematocrit of 26.6% with normal indices.  A BNP of 1470  on admission.  An occult blood positive stool positive.   IMPRESSION:  Multifactorial dyspnea that has not improved during  hospitalization with treatment directed at possible congestive heart  failure/volume overload associated with acute on chronic renal failure.  I believe her dyspnea is multifactorial but agree that there is  definitely a chronic obstructive pulmonary disease exacerbation present  here as evidenced by the increasing congested/rattling cough  prior to  her acute exacerbation.  She is describing somewhat discolored sputum,  so a Gram stain and culture would be in order along with a sinus CT scan  and aggressive treatment directed at the asthmatic component with IV  Solu-Medrol and around-the-clock bronchodilators.   There probably is a component of cardiac asthma at present here.  Whether she has component of systolic dysfunction or simply diastolic  dysfunction related to renal insufficiency is difficult to tell at this  point.  A repeat BNP and sedimentation rate are pending to look at the  issue of interstitial lung disease as well as follow-up chest x-ray for  tomorrow morning. Finally, reflux and/or aspiration in the elderly is  another mechanism that frequently leads to new-onset cough and with  associated interstitial lung disease in this population.   Anemia with occult pos stools could contribute to a compoonent of her  dyspnea also, defer w/u to primary svc.   Sinusitis might be contributing to cough and needs to be excluded as  well.      Bethany Graham. Sherene Sires, MD, [facility]  Electronically Signed     MBW/MEDQ  D:  12/21/2007  T:  12/22/2007  Job:  [id number]

## 2011-05-07 NOTE — Assessment & Plan Note (Signed)
[facility]                            CARDIOLOGY OFFICE NOTE   NAME:Fonner, STORM DULSKI                       MRN:          [id number]  DATE:05/27/2007                            DOB:          [birth year]    Ms. Lansky is seen for followup. She is really doing remarkably well. She  is here with her family member today. She has seen Dr. Everardo All. She does  have ongoing dizziness which does sound like vertigo. She is not having  any chest pain. She does have some episodes of getting sweating in her  head and upper shoulders. She does not have chest pain or shortness of  breath with this. Etiology is not clear but I am not convinced that it  is cardiac. She has edema during the day which resolves at night. Her  overall volume status appears to be stable.   PAST MEDICAL HISTORY:   ALLERGIES:  SULFA, PENICILLIN, CODEINE, ULTRAM, AND TETRACYCLINE.   MEDICATIONS:  1. Detrol.  2. Furosemide 40 b.i.d.  3. Ecotrin.  4. Imipramine.  5. Omeprazole.  6. Carvedilol.  7. Meclizine.  8. Fish oil.  9. Nitrofurantoin.  10.Colace.   OTHER MEDICAL PROBLEMS:  See the list below.   REVIEW OF SYSTEMS:  She is doing well. We talked about her dizziness and  she has a mild tremor. Otherwise her review of systems is negative.   PHYSICAL EXAMINATION:  Blood pressure is 114/64, with a pulse of 80.  Patient is oriented to person, time, and place, and her affect is  normal.  She has significant kyphoscoliosis.  LUNGS: Clear. Respiratory effort is not labored.  She has no xanthelasma. She has normal extraocular motion. Marland Kitchen  CARDIAC EXAM: Reveals an S1 with an S2. There are no murmurs heard.  ABDOMEN: Soft. She has no masses or bruits.  She has trace to 1 + peripheral edema at this time.   Problems are listed extensively on my note of November 24, 2006. She is  stable. She needs a BMET today. Otherwise she is stable and no further  workup is needed.     Luis Abed,  MD, [facility]  Electronically Signed    JDK/MedQ  DD: 05/27/2007  DT: 05/27/2007  Job #: [id number]   cc:   Gregary Signs A. Everardo All, MD

## 2011-05-07 NOTE — Discharge Summary (Signed)
NAME:  Bethany Graham, Bethany Graham                ACCOUNT NO.:  [id number]   MEDICAL RECORD NO.:  [id number]          PATIENT TYPE:  INP   LOCATION:  1506                         FACILITY:  [facility]   PHYSICIAN:  Rosalyn Gess. Norins, MD  DATE OF BIRTH:  07-08-1916   DATE OF ADMISSION:  12/13/2007  DATE OF DISCHARGE:  12/29/2007                               DISCHARGE SUMMARY   ADMISSION DIAGNOSES:  1. Cough and shortness of breath with chronic obstructive pulmonary      disease exacerbation.  2. History of congestive heart failure.  3. Diabetes.  4. Hypertension.   DISCHARGE DIAGNOSES:  1. Cough, multifactorial, now resolved.  2. Congestive heart failure, improved, although persistent peripheral      edema.  3. Anemia, stable.  4. Chronic kidney disease, stable.  5. Diabetes exacerbated by steroids, currently stable.  6. Chronic obstructive pulmonary disease, stable.   CONSULTANTS:  1. Madolyn Frieze Jens Som, MD, of cardiology.  2. [name]. Sherene Sires, MD, with pulmonary.  3. Bernette Redbird, MD, for gastroenterology.   PROCEDURES:  1. Chest x-ray December 13, 2007, with chronic pulmonary changes with      no infiltrates.  2. Chest x-ray December 23 with cardiomegaly and mild interstitial      edema with emphysema.  3. Chest x-ray December 26, essentially stable with slight diffuse      interstitial edema superimposed on chronic interstitial disease.  4. Chest x-ray December 31 with interval increase in small bilateral      pleural effusions, stable interstitial edema.  5. CT of the sinus December 22, 2007, with minimal mucoperiosteal      thickening of the right maxillary sinus but otherwise clear.  6. Chest x-ray December 25, 2007, with congestive heart failure with      interstitial edema without change.   HISTORY OF PRESENT ILLNESS:  The patient is an 77 year old Caucasian  female who presented with a 3-day history of severe cough that was  interfering with sleep.  She presented to the emergency  department, at  that time described to be wheezing but was better after nebulizer  treatment.  Because of her refractory cough, she was admitted to the  hospital for further evaluation and treatment.   PAST MEDICAL HISTORY:  1. CAD with bypass surgery in 1999 with an EF of 40-45% in the setting      of a right bundle branch block.  2. Left lower lobe nodule.  3. History of vertigo.  4. Hyperlipidemia.  5. Peripheral vascular disease.  6. Glaucoma.  7. Degenerative joint disease.  8. Peripheral vascular disease.  9. History of chronic renal insufficiency.  10.Hypoxemia.  11.Diabetes, diet-controlled.  12.History of DVT bilaterally diagnosed August 2008.  13.COPD.   MEDICATIONS AT ADMISSION:  1. Protonix 40 mg b.i.d.  2. Ativan 0.5 mg b.i.d. p.r.n.  3. Detrol LA 4 mg daily.  4. Lasix 40 mg b.i.d.  5. Carvedilol 6.25 mg b.i.d.  6. Potassium 20 mEq daily.  7. Meclizine 12.5 mg t.i.d.  8. Coumadin 5 mg daily except Wednesdays, when she takes 2.5 mg.   DRUG  ALLERGIES:  SULFA ANTIBIOTICS, PENICILLIN ANTIBIOTICS, CODEINE,  ULTRAM, TETRACYCLINE, CIPRO, ACE INHIBITORS which give a cough.   HOSPITAL COURSE:  Problem 1.  PULMONARY:  Patient with a probable exacerbation of her COPD  with multifactorial cough.  For her dyspnea she was seen in consultation  by the cardiology service, Dr. Olga Millers.  He thought that there  may be a small contributory role of her chronic congestive failure with  an elevated BNP.  He did recommend that she have a 2-D echo although she  had had one July 2007.  This was not repeated during her hospital stay.  The patient was continued on diuretic therapy and other medical regimen.  Her x-rays were as noted with chronic interstitial edema which remained  stable.  In addition, the patient was seen by Dr. Sherene Sires for pulmonary.  He also felt that her cough was multifactorial, as was her dyspnea.  He  felt there was an exacerbation of her COPD.  He was also  concerned about  a possible sinus infection so a CT scan was ordered; however, this did  not confirm a significant sinus infection.  The patient was treated  aggressively with IV Solu-Medrol and around-the-clock bronchodilators.  On this regimen the patient slowly improved so she was able to be  switched to oral prednisone and tapered down.  Bronchodilators were then  scheduled on a regular basis.  She continued to do well and at the time  of this dictation her cough is minimal, respiratory function is stable.   Problem 2.  ANEMIA:  The patient had anemic of disease, which remained  stable during this hospitalization with a final hemoglobin of 9.2 g.   Problem 3.  CHRONIC KIDNEY DISEASE:  Patient with a creatinine of 1.5 at  the time of discharge, which is stable.  This probably underestimates  her renal function; however, she is doing well with no significant  hyperkalemia, no significant fluid retention.   Problem 4.  DIABETES:  Patient with steroid-exacerbated diabetes,  recently with good CBG control on her present medical regimen with the  last four values being 47, 78, 94 and 142.  The plan is to continue her  present medical regimen.   Problem 5.  BILATERAL LOWER EXTREMITY DEEP VEIN THROMBOSES:  The patient  is doing well at this time.  She will continue on anticoagulation  therapy.   Problem 6.  CORONARY ARTERY DISEASE, STATUS POST CORONARY ARTERY BYPASS  GRAFT, WITH A MILD ISCHEMIC CARDIOMYOPATHY:  The patient does seem  stable with no evidence of decompensation.   Problem 7.  HISTORY OF DYSLIPIDEMIA:  At age 28 this is of minimal  significance.  We will continue her home regimen.   Problem 8.  HISTORY OF PEPTIC ULCER DISEASE:  The patient had no active  source of bleeding during this hospital stay.  She was seen in  consultation by Dr. Matthias Hughs, who felt that she was not a candidate for  aggressive diagnostic evaluation.  Therefore, endoscopy was not  recommended.  He  felt if she had significant acute bleeding that  intervention might be appropriate.  He did recommend continuing with  high-dose proton pump inhibitor therapy.   With the patient's medical problems stabilizing, at this point she is  thought to be a candidate for several weeks of reconditioning at a  skilled care facility before returning home to live alone.   DISCHARGE EXAMINATION:  Temperature was 97.5, blood pressure 130/79,  heart rate was 66,  respirations were 20, O2 saturation was 96% on room  air.  Final CBG was 142.  GENERAL APPEARANCE:  This is a pleasant elderly woman, sitting in a  chair.  She is in no acute distress.  HEENT:  Unremarkable.  CHEST:  The patient is moving air well.  She has no increased work of  breathing, was using no accessory musculature of respiration.  The  patient has no rhonchi, no wheezing.  O2 saturation is excellent.  She had 2+ peripheral pulses.  Her precordium was quiet.  She had a  regular rate and rhythm.  She had no JVD in a sitting position.  ABDOMEN:  Soft with no guarding or rebound.  EXTREMITIES:  With minimal edema.  No further examination conducted.   FINAL LABORATORY:  Basic metabolic panel December 29, 2007, with sodium  141, potassium 3.4, chloride of 100, CO2 of 37, BUN of 36, creatinine  1.28, glucose is 110, GFR estimated at 39 mL/min.  BNP from January 4  was 1510, although the patient does not seem decompensated.  Final  hemoglobin and hematocrit of January 4 with a hemoglobin of 9.3 g and  hematocrit 28.1%.  Radiographic studies as noted.   DISCHARGE MEDICATIONS:  1. The patient will continue on Coumadin as in her home regimen, which      is 5 mg daily, 2.5 mg on Wednesday.  2. Ambien 5 mg q.h.s. for insomnia.  3. Protonix 40 mg p.o. b.i.d.  4. Calcitriol 0.25 mcg daily.  5. Artificial tears to both eyes daily.  6. Metoclopramide 5 mg q.a.c. and h.s.  7. Mucinex tablets two tablets b.i.d.  8. Pulmicort Respules per  hand-held nebulizer treatment 0.5 mg in 2      mL, treatment b.i.d.  9. Brovana 15 mcg/2 mL in the nebulized solution, 15 mcg b.i.d.  10.Prednisone at 40 mg daily for five additional days but then go to      30 mg daily for five days, 20 mg daily for five days, then      reassess.  11.Lasix 80 mg p.o. daily.  12.Colace 50 mg q.h.s.  13.Ativan 1 mg tablet p.o. q.6h. p.r.n. agitation.  14.Tylenol 650 mg p.o. q.4h. p.r.n.  15.Phenergan With Codeine cough syrup 2.5/5 mL p.o. q.6h. p.r.n.  16.Milk of magnesia 30 mL p.o. q.6h. p.r.n.   DISPOSITION:  The patient is transferred to a skilled care facility.  The patient may follow up with her primary care Preeya Cleckley on an as-needed  basis or in three weeks for follow-up.   In the facility the patient is to have PT and OT evaluation and  treatment.  The patient may follow all in-house protocols for routine  care.  The patient may have leave of absence with family.  The patient  will need social work consult prior to discharge from the skilled care  facility to ensure she has adequate support at home.   The patient's condition at the time of this discharge dictation was  stable and improved.      Rosalyn Gess Norins, MD  Electronically Signed     MEN/MEDQ  D:  12/29/2007  T:  12/29/2007  Job:  [id number]   cc:   Madolyn Frieze. Jens Som, MD, [facility]  [address]  [address]  [place]  [place] [address]   Bernette Redbird, M.D.  Fax: [phone]   Sean A. Everardo All, MD  [address]  [place]  [place] [address]

## 2011-05-07 NOTE — Discharge Summary (Signed)
NAME:  Bethany Graham, Bethany Graham                ACCOUNT NO.:  [id number]   MEDICAL RECORD NO.:  [id number]          PATIENT TYPE:  INP   LOCATION:  6714                         FACILITY:  [facility]   PHYSICIAN:  Valerie A. Felicity Coyer, MDDATE OF BIRTH:  12/01/1916   DATE OF ADMISSION:  08/20/2007  DATE OF DISCHARGE:  08/27/2007                               DISCHARGE SUMMARY   DISCHARGE DIAGNOSES:  1. Bilateral lower extremity deep venous thrombosis.  2. Coronary artery disease status post coronary artery bypass graft      May 1999 with ischemic cardiomyopathy and chronic systolic heart      failure.  3. Diabetes type 2.  Continue diabetic diet.  4. Hypertension.  5. Dyslipidemia.  6. Chronic obstructive pulmonary disease, home oxygen-dependent.  7. Status post right total hip arthroplasty June 2008.   HISTORY OF PRESENT ILLNESS:  Bethany Graham is an 77 year old female who was  admitted on August 20, 2007 with leg swelling.  She had recently been  discharged from a nursing facility after having a total hip  arthroplasty.  She noted that her left leg swelling was also slightly  painful.  She was admitted for further evaluation and treatment.   PAST MEDICAL HISTORY:  1. Diabetes type 2.  2. Osteoporosis.  3. Osteoarthritis.  4. Difficulty swallowing.  Refused workup several years ago.  5. Hypertension.  6. GERD.  7. Varicose veins of the lower extremity.  8. Colonic polyps.  9. Glaucoma.  10.Spinal stenosis.  11.Coronary artery disease.  12.DVT right lower extremity in 1999.  13.Cough due to ACE inhibitor.  14.Chronic insomnia.  15.Laxative abuse.  16.Postural hypotension.   COURSE OF HOSPITALIZATION:  1. Bilateral lower extremity DVTs.  The patient was admitted and      underwent bilateral lower extremity Dopplers which were positive      for bilateral lower extremity DVT.  V/Q scan showed low probability      for PE.  She was started on full-dose Lovenox as well as Coumadin.      At time of  this dictation, her INR is subtherapeutic with a value      of 1.9.  She will be maintained on the Lovenox bridge until      therapeutic x48 hours.  As the patient does not drive and has      difficulty finding transportation, we will ask home health RN to      assist with PT/INR draws and to contact Shelby Dubin at the [facility]      [facility] for further instructions on Coumadin and Lovenox dosing.  2. Diabetes type 2.  The patient's hemoglobin A1c was checked which      was 5.9 on June 4.  Continued diet management.   The patient did complain of some nausea and right upper quadrant pain  during this admission and an ultrasound was performed which showed  cholelithiasis however no signs of acute cholecystitis.  The patient is  currently tolerating POs and abdominal pain has resolved.   MEDICATIONS AT TIME OF DISCHARGE:  1. Coumadin 5 mg p.o. daily in the evening.  2. Lovenox 65 mg subcu injection once daily.  3. Coreg 6.25 mg p.o. b.i.d.  4. Detrol 4 mg p.o. daily.  5. Protonix 40 mg p.o. b.i.d.  6. Imipramine 50 mg p.o. daily.  7. Lasix 40 mg p.o. b.i.d.  8. Calcitriol 0.25 mg daily.  9. Vytorin 10/20 one tablet p.o. daily.  10.Altace 2.5 mg p.o. daily.  11.Lorazepam 0.5 mg p.o. t.i.d.  12.Albuterol one puff 4 times daily as needed.  13.Estrace 0.01% cream 3 times weekly.  14.Potassium chloride 20 mEq p.o. once daily.  15.Ambien 5 mg p.o. daily at bedtime.   PERTINENT LABORATORIES AT TIME OF DISCHARGE:  Hemoglobin 13.3,  hematocrit 40.1, INR 1.9.   DISPOSITION:  The patient will be discharged to home.   FOLLOWUP:  The patient is scheduled to follow up with Dr. Romero Belling  on September 15 at 11:15 a.m.  We will ask a home health RN to assist  with Lovenox injections until Lovenox bridge complete.  We will also ask  the home health RN to check PT/INRs and call Shelby Dubin with these  results.  We have asked for PT/INR to be drawn tomorrow August 28, 2007 as well as Monday  August 31, 2007 and then weekly thereafter.      Sandford Craze, NP      Raenette Rover. Felicity Coyer, MD  Electronically Signed    MO/MEDQ  D:  08/27/2007  T:  08/27/2007  Job:  [id number]   cc:   Gregary Signs A. Everardo All, MD  Shelby Dubin, PharmD, BCPS, CPP
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
## 2011-05-07 NOTE — Consult Note (Signed)
NAME:  Bethany Graham, Bethany Graham                ACCOUNT NO.:  [id number]   MEDICAL RECORD NO.:  [id number]          PATIENT TYPE:  INP   LOCATION:  1506                         FACILITY:  [facility]   PHYSICIAN:  Madolyn Frieze. Jens Som, MD, FACCDATE OF BIRTH:  1916/04/21   DATE OF CONSULTATION:  12/18/2007  DATE OF DISCHARGE:                                 CONSULTATION   HISTORY OF PRESENT ILLNESS:  Mrs. Gudgel is an 77 year old female with  past medical history of coronary disease status post bypassing graft,  renal insufficiency, hypoxemia on home oxygen, diabetes, hypertension,  hyperlipidemia, deep venous thrombosis who we are asked to evaluate for  dyspnea.  Note, the patient has had a previous coronary bypassing graft  in 1999.  Her most recent echocardiogram was performed in July 2007.  At  that time, she had an ejection fraction of 40-45% with inferior akinesis  as well as posterior akinesis.  There was mild mitral regurgitation.  She had been treated medically for coronary disease and followed closely  by Dr. Myrtis Ser.  She was admitted on December 13, 2007, with complaints of  chills, nonproductive cough and congestion.  She was felt to have a COPD  flare and has been treated with steroids, antibiotics and  bronchodilators.  Note, her initial BUN and creatinine were 39 and 1.46.  Her initial BNP was 767.  She has also been given diuretics since  admission for an elevated BNP.  However her cough has persisted and her  BNP is now increased to 1470.  Her BUN and creatinine is also increased  to 96 and 3.59.  Cardiology is now asked to further evaluate.  Note, the  patient does complain of chest pain, but only with cough.   PRESENT MEDICATIONS:  1. Detrol LA 4 mg p.o. daily.  2. Carvedilol 6.125 mg p.o. b.i.d.  3. Albuterol 2.5 mg inhaled q.i.d.  4. Atrovent.  5. Coumadin as directed.  6. Guaifenesin.  7. Ambien 5 mg p.o. q.h.s.  8. Protonix 40 mg p.o. b.i.d.  9. Calcitriol 0.25 mcg daily.  10.Septra one p.o. b.i.d.  11.Lasix 40 mg p.o. at night and 80 mg in the morning.  12.Potassium 12 mEq p.o. b.i.d.  13.Prednisone 40 mg p.o. daily.   ALLERGIES:  PENICILLIN, TETRACYCLINE, CEPHALOSPORIN, TRAMADOL,  CIPROFLOXACIN, LATEX, SULFA BY REPORT AND AZITHROMYCIN.   SOCIAL HISTORY:  She has remote tobacco use, but she has not smoked  since the 1970s.  She does not consume alcohol.   FAMILY HISTORY:  Positive for coronary disease.   PAST MEDICAL HISTORY:  1. Diabetes mellitus.  2. Hypertension.  3. Hyperlipidemia.  4 . History of a lung nodule followed by Dr. Sherene Sires.  1. Coronary artery disease and is status post coronary bypass graft in      1999.  2. Peripheral vascular disease followed by Dr. Edilia Bo.  3. History of peptic ulcer disease.  4. Back surgery.  5. Prior brain aneurysm surgery.  6. Fractured hip in June 2008 that was treated, and she subsequently      developed deep venous thrombosis and is on Coumadin  for that now.  7. History of renal insufficiency as well.  8. Prior tonsillectomy.   REVIEW OF SYSTEMS:  She denies any headaches or fevers that she has had  chills.  She has also had a cough for the past one week that has not  improved.  There is no hemoptysis.  There is no dysphagia, odynophagia  or melena.  She does occasionally have hematochezia, but she attributes  to her hemorrhoids.  She has no history hematuria or dysuria.  There is  occasional orthopnea.  There is no PND or pedal edema.  She does have  chronic dyspnea on exertion which is unchanged.  Remaining systems are  negative.   PHYSICAL EXAMINATION:  VITAL SIGNS:  Today shows a blood pressure of  119/67 and pulse 86.  She has temperature of 98.1.  Respiratory 15.  She  is 94% on 2 liters.  GENERAL:  She is well-developed and frail.  She does not appear to acute  distress.  Her skin is warm and dry although she has multiple ecchymosis  over her body.  She does not appear to be depressed.  There  is no  peripheral clubbing.  Her back is normal.  HEENT:  Normal with normal eyelids.  NECK:  Supple with a normal upstroke bilaterally.  I cannot appreciate  bruits.  There is no jugular distention.  I cannot appreciate  thyromegaly.  CHEST:  Shows diminished breath sounds throughout and  there is diffuse expiratory rhonchi and mild expiratory wheeze.  CARDIOVASCULAR:  Regular rate and rhythm.  There is a 2-3/6 systolic  murmur at the apex that radiates to the left axilla.  There is also a  2/6 systolic murmur at the upper left sternal border.  S2 was not  diminished.  I cannot appreciate S3-S4.  ABDOMEN:  Shows mild tenderness of the right abdomen.  There is no  hepatosplenomegaly or masses palpated.  I cannot appreciate a bruit.  She has 1+ femoral pulses bilaterally.  No bruits.  EXTREMITIES:  Show no edema and I can palpate no cords.  She does have  chronic skin changes.  Distal pulses are diminished.  NEUROLOGIC:  Grossly intact.   LABORATORY DATA:  BNP of 1470.  Her sodium is 135 with potassium 5.6.  BUN and creatinine 96 and 3.59.  Her INR is 3.7.  Her white blood cell  count is 9.7 with a hemoglobin 9.3, hematocrit 27.7.  Platelet count  252.  Chest x-ray from December 23 showed cardiomegaly and showed mild  interstitial edema superimposed on emphysema.   DIAGNOSIS:  1. Dyspnea - this appears to be predominantly a COPD exacerbation/URI.      I would recommend continuing with bronchodilators and steroids.      She also needs antibiotics, and I would ask pulmonary to evaluate      to optimize her medical therapy.  She will continue on her own home      oxygen.  Note, her BNP is elevated, but she is not volume      overloaded on exam, and it is clearly prerenal by her BUN and      creatinine ratio.  I would discontinue her Lasix as well as her      potassium and begin gentle hydration.  We need to follow her renal      function closely.  Note, some of her an elevated BNP may be  related      to her renal insufficiency.  Will  check an echocardiogram to      quantify LV function as well as mitral regurgitation.  We will plan      to resume lower dose diuretics as her renal function improves.  We      will also order an electrocardiogram as there is not one on the      chart.  2. Coronary disease status post coronary artery bypass graft - she is      on Coumadin at present and is most likely off of aspirin due to her      age and increased risk of bleeding.  After she completes her course      of Coumadin, she will resume an aspirin.  She would also benefit      from a statin long term, but I will leave this to Dr. Myrtis Ser an      outpatient.  3. Renal insufficiency - as per above, we are holding her diuretics      and potassium.  I would also discontinue her Septra as this may be      contributing to renal insufficiency as well.  4. Diabetes mellitus - per the primary service.  5. Hypertension - blood pressure is adequately control.  We will      discontinue her Coreg at this point as it may be exacerbating her      pulmonary status.  We would recommend resuming a selective beta      blocker such as Toprol when her pulmonary status improves.  6. Hyperlipidemia - as per above, she would benefit from statin long-      term.  7. DVT - she will continue on her Coumadin.  8. History of anemia - per primary care service.   Will be happy to follow while she is in the hospital.      Madolyn Frieze. Jens Som, MD, [facility]  Electronically Signed     BSC/MEDQ  D:  12/18/2007  T:  12/18/2007  Job:  [id number]

## 2011-05-07 NOTE — Assessment & Plan Note (Signed)
[facility]                            CARDIOLOGY OFFICE NOTE   NAME:Graham, Bethany STENSLAND                       MRN:          [id number]  DATE:11/06/2007                            DOB:          [birth year]    Bethany Graham is seen for Cardiology followup.  I had seen her in the  office on May 27, 2007.  Unfortunately, the following day she fell and  fractured her hip.  She was  hospitalized at [facility] from June 4 to  June 04, 2007.  She had a right subcapital femoral neck fracture.  Her  course was complicated but she stabilized.  She then went to rehab.  The  patient then was admitted on August 20, 2007, and discharged August 27, 2007.  At that time she had bilateral lower extremity deep venous  thrombosis.  She was treated with Lovenox and Coumadin.  She has been on  Coumadin since then.  Her VQ scan showed no evidence of a pulmonary  embolus.  She has stabilized over time and she is here in the office  today.   The patient, of course, also has significant coronary disease and other  problems.  Despite this, she continues to be feisty and she has been  doing well.   She is not having any chest pain.  She is not having any significant  shortness of breath.   PAST MEDICAL HISTORY:  Allergies:  1. SULFA.  2. PENICILLIN.  3. CODEINE.  4. ULTRAM.  5. TETRACYCLINE.   MEDICATIONS:  Detrol LA, furosemide, carvedilol, Potassium,  pantoprazole, warfarin as directed.   Other Medical Problems:  See the list below.   REVIEW OF SYSTEMS:  She actually has no complaints today.  She wants to  stop her Coumadin.  I have reviewed the situation and it is clear she  cannot stop her Coumadin.  I explained this to her and she understands.  Otherwise, her review of systems is negative.   PHYSICAL EXAM:  Blood pressure today is 130/71 with a pulse of 87.  The  patient is oriented to person, time and place.  Affect is normal.  HEENT:  Reveals no xanthelasma.   She has normal extraocular motion.  There are no carotid bruits.  There is no jugular venous distention.  LUNGS:  Clear.  Respiratory effort is not labored.  CARDIAC EXAM:  Reveals an S1 with an S2.  She has a systolic murmur.  ABDOMEN:  Soft.  She has no masses or bruits.  She has no significant  peripheral edema.   EKG reveals sinus rhythm.  She has a right bundle branch block that is  old.  There is also an old inferior MI.   PROBLEMS:  1. Coronary disease.  2. History of coronary artery bypass graft in 1999.  3. History of a lung nodule followed by Dr. Sherene Sires.  4. History of dizziness historically that has been vertigo.  5. Hypertension, treated.  6. Ejection fraction 40 to 45 percent after a posterior infarct early      in 2007.  7. Lipid  abnormalities.  8. Peripheral vascular disease followed by Dr. Cari Caraway.  9. Peptic ulcer disease.  10.Glaucoma.  11.Degenerative joint disease.  12.Old right bundle branch block.  13.A right hand hematoma that disappeared finally over many months.  14.Renal insufficiency that stabilized.  15.Volume status stable.  16.Fractured hip in June of 2008 that stabilized.  17.Bilateral deep venous thrombosis in August of 2008, and she is now      on Coumadin.   At this time Coumadin needs to be continued.  At 6 months, she and I can  discuss further the approach.  She is stable.  She does not feel well on  a cholesterol medicine and it was not restarted today.     Luis Abed, MD, [facility]  Electronically Signed    JDK/MedQ  DD: 11/06/2007  DT: 11/08/2007  Job #: [id number]

## 2011-05-07 NOTE — Assessment & Plan Note (Signed)
[facility]                                 ON-CALL NOTE   NAME:Bethany Graham, [name]                         MRN:          [id number]  DATE:05/22/2007                            DOB:          Mar 13, 1916    TIME OF CALL:  8:12 p.m.   CALLER:  Eunice Blase.   She sees Dr. Everardo All.   PHONE NUMBER:  [phone].   The caller reports that the patient is complaining of pain in the upper  outer right thigh region with onset earlier this evening. There has been  no history of trauma and no falls. She spent a good deal of time in her  yard today crawling on her hands and knees. So far she has been resting.  The called applied an ice pack to her thigh and the patient did take one  Darvocet, they wonder what to do from here. The caller states there is  no obvious swelling, no redness, no warmth, etc. in her leg. My advice  is to continue rest and ice. She can use Darvocet 1 or 2 every 4 hours  as needed for discomfort. If she is not better by tomorrow, I suggested  taking her to the emergency room or an urgent care center for further  evaluation.     Tera Mater. Clent Ridges, MD  Electronically Signed    SAF/MedQ  DD: 05/22/2007  DT: 05/23/2007  Job #: [id number]

## 2011-05-07 NOTE — Consult Note (Signed)
NAME:  Bethany Graham, Bethany Graham                ACCOUNT NO.:  [id number]   MEDICAL RECORD NO.:  [id number]          PATIENT TYPE:  INP   LOCATION:  1506                         FACILITY:  [facility]   PHYSICIAN:  Bernette Redbird, M.D.   DATE OF BIRTH:  21-Apr-1916   DATE OF CONSULTATION:  12/23/2007  DATE OF DISCHARGE:                                 CONSULTATION   This is a gastroenterology consultation.   Dr. Rene Paci of the [facility] asked [place] to see this  77 year old female because of Hemoccult-positive stool in the setting of  anticoagulation therapy for DVTs.  The patient does not have any active  GI symptoms but, a couple of years ago, had endoscopic evaluation by Dr.  Madilyn Fireman showing a gastric ulcer.  Apparently she was treated twice for  Helicobacter pylori infection.  In the hospital, she has been treated  for an exacerbation of COPD with associated dyspnea and has run INRs as  high as 4.1.  She has had 2 Hemoccult-positive stools, but her  hemoglobin has been stable at 9.5.  In reviewing E chart, I see where  she had endoscopic evaluation by Dr. Madilyn Fireman in 2005, almost 4 years ago,  at which time she had endoscopic confirmation of healing of her ulcer.   The patient indicates she has had colonoscopy by Dr. Madilyn Fireman in the past  and has been told she has not needed further surveillance exams due to  her age.     Of note, the patient used to be on aspirin but has not been for about  the past 6 months.  She does take Protonix chronically as an outpatient  twice daily.   PAST MEDICAL HISTORY:   ALLERGIES:  PENICILLIN, TETRACYCLINE, CEPHALOSPORINS, TRAMADOL, CIPRO,  LATEX, AND SULFA.  ALSO AZITHROMYCIN.   CURRENT MEDICATIONS:  Current medications include Ventolin, Atrovent,  warfarin, insulin, Ambien, Protonix 40 mg twice daily, calcitriol,  Colace, clotrimazole, Reglan, guaifenesin, methylprednisolone, Pepcid,  and Lasix.   PAST SURGICAL HISTORY:  Operations include  coronary bypass surgery 10  years ago, brain aneurysm surgery, back surgery, and surgery for a  fractured hip in June of this year complicated by subsequent DVT for  which she is currently on the above-mentioned Coumadin.   MEDICAL ILLNESSES:  1. DVT 6 months ago, currently still on Coumadin.  2. Diabetes.  3. Hypertension.  4. Hyperlipidemia.  5. Severe chronic obstructive pulmonary disease.  6. Previous history of peptic ulcer disease with Helicobacter pylori      infection.  7. Chronic renal insufficiency.   HABITS:  Current nonsmoker (none for many years), nondrinker.   FAMILY HISTORY:  Not obtained.   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   REVIEW OF SYSTEMS:  Occasional rectal bleeding if she gets diarrhea.  No  chronic GI complaints   PHYSICAL EXAMINATION:  PULMONARY:  Diminished breath sounds, especially  on the right, with a few rhonchi at the left base.  HEART:  Heart sounds difficult to hear.  ABDOMEN:  Without guarding, mass, or tenderness.  RECTAL:  Shows brown stool, no masses.  time friends insertion less   DISCUSSION AND PLAN:  This is an elderly patient with significant  comorbidity (severe COPD) with occultly heme-positive stool but no  evidence of active GI bleeding while on therapeutic anticoagulation.  She has been covered previously and is covered currently with high-dose  PPI therapy.   In my opinion, the likelihood that she would have an ulcer in the  absence of ulcerogenic medications while on high-dose PPI therapy is  very low.  The occult heme-positivity could be from any of a number of  causes that, as noted, are not associated with any significant active  drop in hemoglobin or evidence of melenic stool on current exam.   In my opinion, doing endoscopic evaluation would not necessarily be a  good idea in this patient.  For one thing, it would be of low yield.  For another, it would be of moderately high risk in view of her   significant COPD.  Yet another reason, a negative endoscopy at this time  would not necessarily indicate that the patient would be free from ulcer  risk in the future.   After discussing the option of endoscopic evaluation versus no  endoscopy, the patient clearly and strongly opts to have no testing, and  I think that this is reasonable.  Certainly, if ever she were to develop  an acute GI bleed, we  could review that decision, but I think it is unlikely since she has  been on Coumadin for approximately 6 months now without any such  incident.   We appreciate the opportunity to have seen this patient in consultation  with you.           ______________________________  Bernette Redbird, M.D.     RB/MEDQ  D:  12/23/2007  T:  12/23/2007  Job:  [id number]   cc:   [name]. Sherene Sires, MD, FCCP  [address]  [place] [address]   Everardo All. Madilyn Fireman, M.D.  Fax: [phone]   Raenette Rover. Felicity Coyer, MD  [address] [place], [place] [address]

## 2011-05-07 NOTE — Discharge Summary (Signed)
NAME:  Bethany Graham, Bethany Graham                ACCOUNT NO.:  [id number]   MEDICAL RECORD NO.:  [id number]          PATIENT TYPE:  INP   LOCATION:  6714                         FACILITY:  [facility]   PHYSICIAN:  Valerie A. Felicity Coyer, MDDATE OF BIRTH:  [birth year]   DATE OF ADMISSION:  08/20/2007  DATE OF DISCHARGE:  08/27/2007                               DISCHARGE SUMMARY   ADDENDUM   The patient was noted to have very elevated cholesterol during this  admission.  She was started on Vytorin which she has appeared to  tolerate here in the hospital, however, she noted that she has an  intolerance to STATINS.  At time of discharge, we will discontinue this  medication and defer further lipid management to the patient's primary  care.  In addition, she also was receiving a low dose of an ACE  inhibitor during this admission, Altace 2.5 mg p.o. daily, and this is  causing some cough.  This will be discontinued as well at time of  discharge and will defer to patient's primary care for further  management of blood pressure as an outpatient.  She is being provided  with a prescription for calcitriol, 0.25 mg p.o. daily.      Sandford Craze, NP      Raenette Rover. Felicity Coyer, MD  Electronically Signed    MO/MEDQ  D:  08/27/2007  T:  08/27/2007  Job:  [id number]   cc:   Gregary Signs A. Everardo All, MD

## 2011-05-07 NOTE — Consult Note (Signed)
NAME:  Bethany Graham, Bethany Graham                ACCOUNT NO.:  [id number]   MEDICAL RECORD NO.:  [id number]          PATIENT TYPE:  INP   LOCATION:  1324                         FACILITY:  [facility]   PHYSICIAN:  Valerie A. Felicity Coyer, MDDATE OF BIRTH:  Apr 03, 1916   DATE OF CONSULTATION:  01/22/2008  DATE OF DISCHARGE:                                 CONSULTATION   REASON FOR CONSULTATION:  Goals of care/symptom recommendations.   REQUESTING PHYSICIAN:  Consult is requested by Vikki Ports A. Felicity Coyer, MD.   PHYSICIAN TO APPEAR ON CONSULT:  Rosanne Sack, M.D.   This nurse practitioner reviewed medical records, received report from  team, assessed patient and then met with [name] who is the  patient's health care power of attorney and who is her niece, and also  with [name] who is her great-niece to discuss goals of care, end  of life wishes, diagnosis, prognosis, disposition and options.  After  this meeting, I also met at the bedside with the patient to discuss all  of the above with her.   PATIENT'S GOALS/RECOMMENDATIONS:  1. DNR.  2. Continue all present medical interventions in an attempt to treat      present illnesses and return to baseline.  3. [place] on Monday, January 25, 2008 with nurse practitioner to      discuss the possibility of transfer to the palliative care unit and      shift from aggressive to comfort care.   IMPRESSION:  This is a 77 year old white woman admitted on January 20, 2008 with right lower extremity cellulitis.  She has a history of COPD,  CHF, hypertension and overall decline in failure to thrive over the past  6 months.  Her family is very concerned about any discharge planning and  are aware of the overall poor prognosis.  The patient and family will  take the weekend to assess the patient's progress and consider the idea  of comfort care versus aggressive medical intervention.  We will  reassess on January 25, 2008.  Palliative care will  continue to support  holistically.  The family is encouraged to call with questions or  concerns.  A Hard Choices booklet is left with the family to review.   PAST MEDICAL HISTORY:  1. COPD.  2. CHF.  3. Hypertension.  4. Hyperlipidemia.  5. Peripheral vascular disease.  6. Glaucoma.  7. Degenerative joint disease.   PAST SURGICAL HISTORY:  1. Coronary bypass graft in 2009.  2. Hip replacement in 2008.   SOCIAL HISTORY:  She is a nonsmoker and nondrinker.  She has been a very  independent woman her whole life until the last 6-8 months.  She has  continued to try to live at home alone with the assistance of her  family, but it has become increasingly more difficult with safety  issues.  She has spent several of the past 9 months at [facility] for rehab.  In spite of heroic attempts, the  patient continues to decline with more frequent hospitalizations and  overall general physical and mental decline.  PHYSICAL EXAMINATION:  GENERAL:  This is an alert, oriented and  cooperative white female.  She is in obvious respiratory distress with  increased work of breathing with conversation.  She is frail.  VITAL SIGNS:  Blood pressure 140/70, temperature 98.8, pulse 88,  respirations 26.  HEENT:  Normocephalic.  Pupils are equal and reactive.  Mucous membranes  are dry.  LUNGS:  Diminished throughout with scattered wheeze and  rhonchi.  ABDOMEN:  Soft, nontender.  Positive bowel sounds.  HEART:  Rate is irregular.  No murmur noted.  EXTREMITIES:  Bilateral lower extremities with muscle wasting.  Dressing  noted on right lower extremity related to the cellulitis.  Coloring poor  on dependency.  Ankle edema +1.   ADMISSION MEDICATIONS:  1. Protonix 40 mg b.i.d.  2. Ativan 0.5 mg b.i.d. p.r.n.  3. Detrol LA 4 mg daily.  4. Lasix 40 mg b.i.d.  5. Carvedilol 6.25 mg b.i.d.  6. Potassium 20 mEq daily.  7. Meclizine 12.5 mg t.i.d.  8. Coumadin 5 mg daily  except Wednesdays when she takes 2.5 mg.   ALLERGIES:  1. SULFA.  2. PENICILLIN.  3. CODEINE.  4. ULTRAM.  5. TETRACYCLINE.  6. CIPRO.  7. ACE INHIBITOR.   Above physical examination, review of systems as indicated in impression  and per family:  Generalized continuous overall decline over the past 9  months.   Total time spent on the unit was 120 minutes.  Counseling and  coordination of care consisted of 50% or greater portion of this  interaction.  Diagnosis and prognosis was clarified.  The concept of  palliative care and Hospice is  reviewed.  The patient and family were educated regarding the team  approach of our service.  Values and goals of care important to the  patient and family were elicited.  Palliative care will continue to  support holistically.  The family is encouraged to call with questions  or concerns.      Herbert Pun, NP      Raenette Rover Felicity Coyer, MD  Electronically Signed    MCL/MEDQ  D:  01/27/2008  T:  01/27/2008  Job:  [id number]   cc:   [facility] of [facility] A. Felicity Coyer, MD  [address] [place], [place] [address]

## 2011-05-07 NOTE — Consult Note (Signed)
NAME:  Bethany Graham, Bethany Graham                ACCOUNT NO.:  [id number]   MEDICAL RECORD NO.:  [id number]          PATIENT TYPE:  INP   LOCATION:  1229                         FACILITY:  [facility]   PHYSICIAN:  Wilmon Arms. Corliss Skains, M.D. DATE OF BIRTH:  06-19-1916   DATE OF CONSULTATION:  01/20/2008  DATE OF DISCHARGE:                                 CONSULTATION   REASON FOR CONSULTATION:  Right lower extremity cellulitis and wound.   The the patient is a 77 year old female with significant comorbidities  who was admitted on January 14, 2008 with complaint of feeling bad.  She was found to have a urinary tract infection.  She was also noted to  have some redness and tenderness in her right posterior thigh.  She was  admitted to the hospital and has been on Bactrim and clindamycin.  She  has apparently not resolved this area.  She was found to have a right  lower extremity DVT.  This was nonocclusive.  Apparently she had a DVT  in the same leg last year and this may be residual.  No imaging has been  done of this area.  Currently this area is dressed only with a clear  Tegaderm dressing.   The patient is currently on Coumadin but her INR is supratherapeutic and  5.2.   HOME MEDICATIONS:  Ambien, Calcitriol, zinc, Lasix, Colace, Tylenol,  Mucinex,  Protonix, Ativan and vitamin C.   ALLERGIES:  PENICILLIN, CODEINE, CIPRO, TETRACYCLINE.   PAST MEDICAL HISTORY:  1. COPD.  2. CHF.  3. Hypertension.  4. Hyperlipidemia,   PAST SURGICAL HISTORY:  1. Coronary bypass graft in 1999.  2. Hip replacement by Dr. Darrelyn Hillock in 2008.   SOCIAL HISTORY:  Nonsmoker, nondrinker.   PHYSICAL EXAMINATION:  VITAL SIGNS:  Temperature 97.2, pulse 70,  respirations 24, sats 98%.  GENERAL:  This is an elderly female who appears quite frail.  She is  able to communicate.  HEENT:  EOMI.  Sclerae anicteric.  EXTREMITIES:  Examination of the right posterior lower extremity so some  localized erythema in the distal portion  of the posterior thigh.  There  are two open areas with eschar on this in the mid distal thigh and the  other is at the top of popliteal fossa.  There is some erythema around  both of these areas.  There is no real fluctuance.  The surrounding skin  tissue actually is fairly soft.  No sign of joint effusion.   LABORATORY DATA:  INR 5.2, creatinine 3.8, BUN 77, potassium 5.4.  White  count 18.9, hemoglobin 9.3, platelet count 232.   IMPRESSION:  1. Right posterior thigh cellulitis and open wounds with eschar.  2. Chronic right lower extremity deep venous thrombosis.  3. Over anticoagulation.   RECOMMENDATIONS:  No acute surgical indications.  There does not seem to  be an abscess.  Even if we were going to debride this area, she is not .  She is not currently a surgical candidate due to her over  anticoagulation.  Will consider asking the wound hospital nurse to  reevaluate and  recommend some type of dressing changes to help medically  debride this area.  Consider maybe Accuzyme.  Consider adding antibiotic  coverage for her cellulitis.  She is currently only on Bactrim.  Would  correct her INR to allow any intervention as needed.  My overall  impression is that she does not need any type of surgical debridement.  The etiology of these wounds are unclear, but this may have something to  do with the underlying DVT.      Wilmon Arms. Tsuei, M.D.  Electronically Signed     MKT/MEDQ  D:  01/20/2008  T:  01/21/2008  Job:  [id number]   cc:   Vikki Ports A. Felicity Coyer, MD  [address] [place], [place] [address]

## 2011-05-07 NOTE — H&P (Signed)
NAME:  Bethany Graham, Bethany Graham                ACCOUNT NO.:  [id number]   MEDICAL RECORD NO.:  [id number]          PATIENT TYPE:  INP   LOCATION:  1612                         FACILITY:  [facility]   PHYSICIAN:  Bethany Lynch. Gioffre, M.D.DATE OF BIRTH:  1916-01-21   DATE OF ADMISSION:  05/27/2007  DATE OF DISCHARGE:                              HISTORY & PHYSICAL   CHIEF COMPLAINT:  Severe right groin pain.   PRESENT ILLNESS:  Bethany Graham is an 77 year old female known patient to  our practice with multiple medical issues.  The patient states that this  last Friday she woke up and she was having a significant amount of pain  in her right groin.  She tried to get up and walk and had to have help  from a caregiver in order to get back into bed and into her chair.  Her  daughter-in-law, who is with her today, says she continues to try to be  very active but she has severe pain in her right groin and hip and has a  hard time walking so she has been crawling out in the garden to do some  work.  She denies any recent fall, no known injuries.  She is here today  in our office for evaluation of her right hip pain.  X-rays revealed a  minimally displaced subcapital hip fracture on the right.  The patient  will be admitted for surgical intervention for the hip fracture,  probable right hip hemiarthroplasty once we get medical and cardiac  clearance for this procedure.   ALLERGIES:  SULFA, CODEINE, PENICILLIN, CIPRO, ULTRAM, TETRACYCLINE.   CURRENT MEDICATIONS:  Detrol, imipramine, Lasix, Ecotrin, omeprazole,  carvedilol, meclizine, Darvocet, Colace, Refresh eye drops, Estrace  cream, Ambien, Proctosol, and nitrofurantoin for a recent urinary tract  infection.   PREVIOUS MEDICAL DIAGNOSES:  1. History of congestive heart failure.  2. History of ischemic cardiomyopathy with an ejection fraction of 45%      in August 2007.  3. Coronary artery disease with history of CABG with right bundle-      branch block.  4. Diabetes.  5. Hypertension.  6. Hyperlipidemia  7. Right internal carotid stenosis.  8. GERD/PUD.  9. Osteoporosis.  10.History of DVT.  11.Vestibular dysfunction with dizziness.  12.Peripheral vascular disease.  13.Glaucoma/cataracts.  14.Renal insufficiency.  15.History of hypoxia on home O2.   Primary care physician is Sean A. Everardo All, MD.  Cardiology:  Luis Abed, MD.  Pulmonology:  [name]. Sherene Sires, MD.  Vascular surgeon:  Di Kindle. Edilia Bo, MD.   SOCIAL HISTORY:  The patient is an elderly female, lives alone at her  home.  She does not smoke or use alcohol.   PHYSICAL EXAMINATION:  VITAL SIGNS:  Height 5 feet 5 inches, weight was  135, blood pressure 130/68, pulse was 70 and regular, respirations are  16, nonlabored.  Patient is afebrile.  GENERAL:  This is an elderly-appearing, frail female in no extreme  distress at this time in a wheelchair.  HEENT:  Head was normocephalic.  Pupils were equal, round and reactive.  Extraocular muscles intact.  Gross hearing is intact.  NECK:  She had fairly good range of motion of her cervical spine without  any discomfort.  No masses.  CHEST:  Lung sounds were distant and equal, no wheezing or rhonchi.  HEART:  Regular.  ABDOMEN:  Soft, nontender.  Bowel sounds present.  EXTREMITIES:  The patient's upper extremities were symmetrically sized  and shaped.  She had no pain with range of motion of her shoulders,  elbows and wrists.   Lower Extremities: Right hip had severe pain in the groin and lateral  trochanteric region with any attempts at flexion-extension, internal-  external rotation.  She had no pain in her left hip with a fairly normal  exam.  Both knees had full extension.  She was able to flex them back to  about 90 degrees.  Ankles were symmetrical, swollen, with good  dorsiflexion and plantar flexion.   PERIPHERAL VASCULAR:  She had good radial pulses.  Unable to palpate  dorsalis pedis or posterior tibial  pulses due to swelling.  Some  generalized edema and very diffuse erythema.  The calves were soft and  nontender.  NEUROLOGIC:  The patient was conscious, alert and appropriate, grossly  neurologically intact.  BREAST, RECTAL AND GU:  Exams were deferred at this time.   IMPRESSION:  1. Right hip subcapital fracture, minimally displaced.  2. History of congestive heart failure with coronary artery disease      with coronary artery bypass graft, right bundle-branch block.  3. History ischemic cardiomyopathy with ejection fraction 45% August      2007.  4. Diabetes mellitus type 2.  5. Hypertension.  6. Hyperglyceridemia  7. History of right internal carotid stenosis.  8. Gastroesophageal reflux disease/peptic ulcer disease.  9. Osteoporosis.  10.Bilateral lower extremity osteoarthritis of the knees.  11.History of deep vein thrombosis.  12.History of vestibular dysfunction with dizziness.  13.History of peripheral vascular disease.  14.History of glaucoma/cataracts.  15.History of renal insufficiency.  16.History of hypoxia on home oxygen.   PLAN:  Bethany Graham will be admitted to [facility] under the care  of Dr. Ranee Graham.  Due to her significant medical history, we will  get Bethany Graham for a cardiac evaluation and we will also get [facility]  evaluation for medical and cardiac clearance prior to a possible right  hip hemiarthroplasty on Friday morning about noontime.  We will request  all the routine labs and tests.  We will also place her on Lovenox for  DVT prophylaxis preop.  Will take her off the aspirin when cleared by  cardiology services, and she is on Lovenox.      Bethany Graham, P.A.    ______________________________  Bethany Graham, M.D.    Bethany Graham/MEDQ  D:  05/27/2007  T:  05/28/2007  Job:  [id number]

## 2011-05-07 NOTE — Consult Note (Signed)
NAME:  Bethany Graham, Bethany Graham                ACCOUNT NO.:  [id number]   MEDICAL RECORD NO.:  [id number]          PATIENT TYPE:  INP   LOCATION:  1612                         FACILITY:  [facility]   PHYSICIAN:  Thomas C. Wall, MD, FACCDATE OF BIRTH:  1916/05/31   DATE OF CONSULTATION:  DATE OF DISCHARGE:                                 CONSULTATION   REFERRING PHYSICIAN:  Ronald A. Darrelyn Hillock, M.D.   We were asked to consult by Dr. Worthy Rancher for preoperative clearance  for [facility].   HISTORY OF PRESENT ILLNESS:  She is a very pleasant 77 year old white  female who has a history of ischemic cardiomyopathy status post coronary  artery bypass surgery.  She presented with a displaced subcapital right  hip fracture on May 27, 2007 to Dr. Jeannetta Ellis office she had apparently  broken last Friday.   CARDIOVASCULAR HISTORY:  Is significant for coronary artery bypass  grafting.  She has some moderate left ventricular dysfunction with EF of  45% with inferior akinesia, inferior hypokinesia, mid posterior  akinesia.  She has class II congestive heart failure by history and  history today.   PAST MEDICAL HISTORY:  Significant for the above as well as:  1. Type 2 diabetes.  2. Hypertension.  3. Hyperlipidemia.  4. Right internal carotid artery stenosis.  5. GERD.  6. Peptic ulcer disease.  7. History osteoporosis.  8. History of DVT.  9. Vestibular dysfunction with chronic dizziness.  10.Peripheral vascular disease.  11.Glaucoma.  12.Cataracts.  13.Renal insufficiency.  14.History of hypoxia on some home O2.   CURRENT MEDS AT HOME:  1. Lasix 40 mg daily.  2. Detrol LA 4 mg a day.  3. Imipramine 50 mg a day.  4. Ecotrin 325 a day.  5. Omeprazole 20 mg p.o. b.i.d.  6. Coreg 6.25 b.i.d.  7. Ambien 5 mg at night.  8. Meclizine 12.5 mg p.r.n.  9. Colace 100 mg a day.  10.Nitrofurantoin 50 mg a day.  11.Estrace cream.   She is on Lovenox here in the hospital.   She is allergic to multiple  medications as listed in chart.   SOCIAL HISTORY:  She is elderly.  She lives alone at home.  She enjoys  gardening.  She does not smoke or drink.  She is an ex-smoker.   REVIEW OF SYSTEMS:  She has not had any recent angina or ischemic  equivalent symptoms.  She has been without any symptoms of TIAs or any  neurological complaints.  The rest of her systems were negative except  for some chronic urinary problems.   EXAM:  Her blood pressure is 142/70, her pulse 68 and regular.  Her  temperature is 97.2, respirations 18, O2 sat 96% on room air.  She is  lying flat and is comfortable.  HEENT:  Normocephalic, atraumatic.  PERLA.  Extraocular movements  intact.  Sclerae clear.  Facial symmetry is normal.  She wears dentures,  upper and lower.  NECK:  Is supple.  Carotids are full with soft systolic sounds  bilaterally.  Thyroid is not enlarged.  Trachea is midline.  LUNGS:  Reveal decreased breath sounds anteriorly throughout.  HEART:  Reveals a poorly appreciated PMI.  She has a soft S1-S2 with  soft systolic murmur.  ABDOMINAL EXAM:  Is soft, good bowel sounds,  nondistended.  No midline bruit.  EXTREMITIES:  Reveal cool extremities  with decreased pulses.  There is no edema.  She has chronic edematous  changes.  Upper extremities are covered with ecchymoses.  MUSCULOSKELETAL:  Chronic arthritic changes and, of course, her right  hip fracture.   KEY LABORATORY DATA:  Is a creatinine of 1.58, BUN of 30, hemoglobin of  11.5, INR of 1.   ASSESSMENT:  1. Ischemic cardiomyopathy status post coronary artery bypass grafting      in the past.  She has an ejection fraction around 40-45% and is      currently class II congestive heart failure.  She is stable at the      present time and presents a low-to-moderate risk for perioperative      complications.  2. Right bundle branch block which is stable.  3. Type 2 diabetes.  4. Hypertension.  5. Hyperlipidemia.  6. Cerebrovascular  disease.  7. Peripheral vascular disease.  8. Chronic renal insufficiency.  9. Severe chronic obstructive pulmonary disease.   RECOMMENDATIONS:  1. Continue with current medical program as ordered.  2. Proceed with surgery at a low-to-moderate risk.  3. Postoperative telemetry with careful follow-up of vital signs, O2      sats, I&Os, daily weights.  4. Continue anticoagulation, as you are doing.  5. If the patient unable take p.o. would substitute Carvedilol with IV      metoprolol, 5 mg IV q. four to keep heart rate less than or equal      to 80.   Thank you very much for the consultation.  I have contacted Dr. Luis Abed, her primary cardiologist.  We will follow along with you.      Thomas C. Daleen Squibb, MD, [facility]  Electronically Signed     TCW/MEDQ  D:  05/28/2007  T:  05/28/2007  Job:  [id number]   cc:   Windy Fast A. Darrelyn Hillock, M.D.  Fax: [phone]   Luis Abed, MD, [facility]  [address]  [address]  [place]  [place] [address]

## 2011-05-07 NOTE — Discharge Summary (Signed)
NAME:  Bethany Graham, Bethany Graham                ACCOUNT NO.:  [id number]   MEDICAL RECORD NO.:  [id number]          PATIENT TYPE:  INP   LOCATION:  1439                         FACILITY:  [facility]   PHYSICIAN:  Georges Lynch. Gioffre, M.D.DATE OF BIRTH:  10-Nov-1916   DATE OF ADMISSION:  05/27/2007  DATE OF DISCHARGE:  06/04/2007                               DISCHARGE SUMMARY   Discharged to [facility].   ADMISSION DIAGNOSES:  1. Right subcapital femoral neck fracture.  2. Coronary artery disease, status post bypass grafting with left      ventricular dysfunction with ejection fraction of 45% with inferior      akinesia, inferior hypokinesia, mid portion akinesia, class II      congestive heart failure.  3. Diabetes, type 2.  4. Hypertension.  5. Hyperlipidemia.  6. Right internal carotid artery stenosis.  7. Gastroesophageal reflux disease.  8. Peptic ulcer disease.  9. History of osteoporosis.  10.History of deep vein thrombosis.  11.History of vestibular dysfunction with chronic dizziness.  12.Peripheral vascular disease.  13.Glaucoma.  14.Cataracts.  15.Renal insufficiency.  16.History of hypoxia on home oxygen.   DISCHARGE DIAGNOSES:  1. Right hip unipolar arthroplasty.  2. Postoperative blood loss anemia, acute, received 2 units of packed      red blood cells with improvement of H&H.  3. Postoperative congestive heart failure, improved with diuresis.  4. Postoperative Coumadin management with an INR goal of 2-3.  5. Postoperative hypokalemia, resolved with oral replacement.  6. History of coronary artery disease with bypass grafting, ejection      fraction 45% with a history of class II congestive heart failure.  7. Diabetes mellitus, type 2.  8. Hypertension.  9. Hyperlipidemia.  10.Right internal carotid artery stenosis.  11.Gastroesophageal reflux disease.  12.Peptic ulcer disease.  13.History of osteoporosis.  14.History of deep vein thrombosis.  15.History of  vestibular dysfunction with chronic dizziness.  16.Peripheral vascular disease.  17.Glaucoma.  18.Cataracts.  19.History of renal insufficiency.  20.History of hypoxia on home oxygen.   HISTORY OF PRESENT ILLNESS:  Bethany Graham is a pleasant 77 year old female  known to our practice with multiple orthopedic issues.  She was found to  have severe right groin pain, presented to the office for evaluation.  X-  rays revealed that she had a right femoral neck subcapital fracture with  some angulation.  She was admitted to the hospital for medical  evaluation and cardiac clearance for a right hip hemiarthroplasty.   ALLERGIES:  1. SULFA.  2. CODEINE.  3. PENICILLIN.  4. CIPRO.  5. ULTRAM.  6. TETRACYCLINE.   MEDICATIONS ON ADMISSION:  Detrol, imipramine, Lasix, Ecotrin,  omeprazole, carvedilol, meclizine, Darvocet, Colace, Refresh eye drops,  Estrace cream, Ambien, Proctosol, nitrofurantoin.   SURGICAL PROCEDURE:  On May 29, 2007, the patient was taken to the OR by  Dr. Ranee Gosselin, assisted by Dr. Marlowe Kays, under went a right  hip unipolar hemiarthroplasty.  The patient tolerated the procedure  well.  There were no complications.  The patient was transferred to the  recovery room and then to the  orthopedic floor in good condition.   CONSULTATIONS:  1. Internal medicine hospitalist consult was requested for medical      evaluation and clearance.  2. [facility] consult was requested for medical evaluation and      clearance and management of medical issues.  3. Routine physical therapy, occupational therapy, case management,      and pharmacy.   HOSPITAL COURSE:  On May 27, 2007, the patient was admitted to [facility], under the care of Dr. Ranee Gosselin.  The patient was  admitted for medical and cardiac evaluation, preoperative checking for  her multiple medical issues for a planned right hip hemiarthroplasty due  to her right hip fracture.  The  patient had all routine labs and tests  performed.  She was evaluated by both cardiac services and internal  medicine services, and the patient was cleared.  The patient was taken  to the OR on May 29, 2007, assisted by Dr. Simonne Come.  She had a right  hip hemiarthroplasty without any complications.  The patient was then  transferred to the recovery room and then to a cardiac monitored floor  for postoperative monitoring and management after her hemiarthroplasty.  Postoperatively, the patient did develop some postoperative blood loss  anemia.  Her hemoglobin did drop down to 8 over 24 but her vital signs  remained stable.  Due to her medical issues and her multiple cardiac  issues, she was typed, crossed, and transfused 2 units of packed red  blood cells with diuretics in between units.  The patient had no  untoward event.  She did have good response.  Her hemoglobin did improve  to 10.2 over 30.9.  During her postoperative phase, the cardiology and internal medicine  services followed and managed the patient.  They felt that she had an  increased amount of fluid retention contributing to some postoperative  congestive heart failure.  They managed this with diuretics and the  patient did diurese without any problems and her fluid status did  improve.  The patient remained very weak and a little short of breath  with activity related to her previous cardiac history.  It was felt she  would need a skilled nursing facility for postoperative care for medical  issues and from a cardiac standpoint and postoperative hip replacement.  She was evaluated and accepted at a skilled nursing facility.  The patient did have a little bit of issues with postoperative  hypokalemia.  She was given p.o. replacement and this did improve.  It was felt on post-op day #6 that she was medically and orthopedically  stable, ready for discharge home per orthopedic services and cardiac services.  Arrangements were  made for her to be discharged to [facility] for continued outpatient care.   LABORATORY:  Admit H&H 11.1 over 34.8, postoperative hemoglobin dropped  to 8 over 24.  She was typed, crossed, and transfused 2 units of packed  red blood cells with improvement of her H&H to 10.2 over 30.9.  Routine  chemistries on June 12th, showed sodium 135, potassium 4.8, BUN 27,  creatinine 1.49, with a glucose of 89.  This was improved from May 29, 2007, with a BUN 30, creatinine 1.88.  Urine culture on admission found  greater than 100,000 E. coli.  This patient was treated with Bactrim DS  during her hospitalization.  BNP on June 10th, found a level of 1270.  INR on June 12th was 1.9.  Glycosylated hemoglobin A1c on June 5th, was  5.9.  Chest x-ray on June 5th, found cardiomegaly with chronic  interstitial lung disease and vascular congestion.  There was increased  basilar atelectasis.  No definite nodule noted in the  right mid lung on  that date's x-ray, although the vascular congestion and atelectasis does  lessen sensitivity.   DISCHARGE INSTRUCTIONS:   DIET:  The patient is to maintain a ADA low concentrated sweet diet with  heart healthy low sodium diet.   ACTIVITY:  The patient may be weightbearing as tolerated with close  supervision and the use of a walker.   WOUND CARE:  The patient needs wound care on a daily basis.  The wound  is to be checked for any signs of infection.  Dressing to be changed on  an as needed basis, frequently throughout the day due to her amount of  serosanguineous discharge.  The staples are to be removed on post-op day  #14 and have Steri-Strips applied, today is post-op day #6.   FOLLOWUP:  1. The patient needs a 2-week followup appointment with Dr. Darrelyn Hillock in      his office, please call [phone], for a followup appointment.  2. The patient needs a followup appointment with Dr. Daleen Squibb or [facility]      Medical for post-op care and  medical management one week from      discharge from skilled nursing facility.   MEDICATIONS:  1. Artificial Tears one drop both eyes daily p.r.n.  2. Coreg 6.125 mg b.i.d.  3. Colace 100 mg p.o. daily.  4. Ferrous sulfate 325 mg p.o. t.i.d.  5. Lasix 40 mg p.o. b.i.d.  6. Tofranil 50 mg p.o. q.h.s.  7. Nitrofurantoin 50 mg p.o. q.h.s.  8. Protonix 40 mg p.o. b.i.d.  9. Potassium chloride 20 mEq p.o. q.a.m.  10.Detrol 4 mg p.o. daily.  11.Ambien 5 mg p.o. q.h.s. p.r.n.  12.Tylenol 650 mg p.o. q.6 h. p.r.n.  13.Reglan 10 mg p.o. q.8 h. p.r.n.  14.Darvocet 1-2 tablets every four to six hours p.r.n. pain.  15.Meclizine 12.5 mg p.o. t.i.d. p.r.n.  16.Coumadin 4 mg a day to maintain an INR of 2-3.   CONDITION ON DISCHARGE:  To skilled nursing facility is improved and  good.      Jamelle Rushing, P.A.    ______________________________  Georges Lynch Darrelyn Hillock, M.D.    RWK/MEDQ  D:  06/04/2007  T:  06/04/2007  Job:  [id number]

## 2011-05-07 NOTE — Op Note (Signed)
NAME:  Bethany Graham, Bethany Graham                ACCOUNT NO.:  [id number]   MEDICAL RECORD NO.:  [id number]          PATIENT TYPE:  INP   LOCATION:  0098                         [facility]ACILITY:  [facility]   PHYSICIAN:  Georges Lynch. Gioffre, M.D.DATE OF BIRTH:  Jul 11, 1916   DATE OF PROCEDURE:  05/29/2007  DATE OF DISCHARGE:                               OPERATIVE REPORT   PREOPERATIVE DIAGNOSIS:  Subcapital fracture, right hip.   POSTOPERATIVE DIAGNOSIS:  Subcapital fracture, right hip.   OPERATION:  Is a unipolar cemented hip replacement, right hip.   SURGEON:  Dr. Darrelyn Hillock.   ASSISTANT:  Dr. Marlowe Kays.   SIZES USED:  I utilized first of all vancomycin in the cement.  I  utilized a size 5 Summit femoral component.  The centralizer was a 14  mm.  The cement restricter was a size 4.  The head size outside diameter  was a size 50, and the femoral component I mentioned was a Summit Basic  cemented size 5.  We utilized a +0 C tapered head.   PROCEDURE:  Under general anesthesia, routine orthopedic prep of the hip  was carried out followed by sterile prep of the right hip.  The patient  was on her left side, right side up.  At this time, the patient has 500  mg of vancomycin IV.  A posterolateral approach to the hip was carried  out.  Bleeders identified and cauterized.  Self-retaining retractors  were inserted.  Incision was made over the iliotibial band.  The gluteal  muscle was dissected by blunt dissection.  I then went down to partially  detached the external rotators.  I protected the sciatic nerve at all  times.  I detached the piriformis.  I then went down, identified the  capsule, did a capsulectomy.  I dislocated the head and removed the head  in the usual fashion.  The head was measured be 50 mm in diameter.  I  then debrided all the soft tissue out of the acetabulum.  I then carried  out my rasping of the femoral shaft in the usual fashion up to a size 5  cemented.  I then water picked out  the femoral canal, inserted a size 4  cement restrainer.  We measured that previously.  While the cement was  being mixed, I first checked the acetabulum, utilized a 50-mm ball and  it fit quite nicely.  Following that, while the cement was being mixed,  I had 1% epinephrine with 500 mL of saline-soaked sponge in the canal  for bleeding purposes.  I then removed that, dried the canal out and  then our the cement restricter as I said was inserted previously.  I  then pressurized the cement, inserted a size 5 Summit Basic femoral  component.  Once the cement was hardened, we made sure there were no  other loose pieces of cement.  We protected the acetabulum to make sure  no cement went in the acetabulum.  Following that, we went through  trials and finally selected a +0 C tapered head with a 50-mm ball, and  we then reduced that into the socket after we made sure the socket was  free and the sciatic nerve was kept out of the way.  We had nice motion  and good stable motion of the hip.  I then irrigated the hip out.  I  inserted 10 mL of FloSeal.  I then closed the wound in layers in usual  fashion.  Sterile dressings were applied.   SURGEON:  Dr. Darrelyn Hillock.   ASSISTANT:  Dr. Marlowe Kays.   A knee immobilizer was placed on the patient in the operating room.           ______________________________  Georges Lynch Darrelyn Hillock, M.D.     RAG/MEDQ  D:  05/29/2007  T:  05/29/2007  Job:  [id number]   cc:   Windy Fast A. Darrelyn Hillock, M.D.  Fax: [phone]   Luis Abed, MD, [facility]  [address]  [address]  [place]  [place] [address]   Gregary Signs A. Everardo All, MD  [address]  [place]  [place] [address]   [name]. Sherene Sires, MD, FCCP  [address]  [place] [address]   Di Kindle. Edilia Bo, M.D.  [address]  [place]  [place] [address]

## 2011-05-10 NOTE — Op Note (Signed)
   NAME:  Bethany Graham, BOCHICCHIO                          ACCOUNT NO.:  [id number]   MEDICAL RECORD NO.:  [id number]                   PATIENT TYPE:  INP   LOCATION:  0482                                 FACILITY[facility]  [facility]   PHYSICIAN:  John C. Madilyn Fireman, M.D.                 DATE OF BIRTH:  June 24, 1916   DATE OF PROCEDURE:  11/02/2003  DATE OF DISCHARGE:                                 OPERATIVE REPORT   PROCEDURE:  Esophagogastroduodenoscopy with biopsy.   INDICATIONS FOR PROCEDURE:  Melenic stools and fall in hemoglobin.   DESCRIPTION OF PROCEDURE:  The patient was placed in the left lateral  decubitus position then placed on the pulse monitor with continuous low flow  oxygen delivered by nasal cannula. She was sedated with 25 mcg IV fentanyl  and 4 mg IV Versed. The Olympus video endoscope was advanced under direct  vision into the oropharynx and esophagus. The esophagus was straight and of  normal caliber with the squamocolumnar line at 34 cm above a 4 cm hiatal  hernia.  There was no visible esophagitis, ring, stricture or other  abnormality of the GE junction. The stomach was entered and there was noted  to be along the lesser curvature just distal to the diaphragm a 1 cm clean  base fairly deep ulcer. The surrounding mucosa appeared normal and there was  no visible suspicious neoplasm. The remainder of the fundus and body and  antrum appeared normal. A CLOtest was obtained from antral mucosa. The  pylorus was nondeformed and easily allowed passage of the endoscope tip into  the duodenum. Both the bulb and second portion were well inspected and  appear to be within normal limits. The scope was then withdrawn and the  patient returned to the recovery room in stable condition. She tolerated the  procedure well and there were no immediate complications.   IMPRESSION:  Proximal gastric ulcer.   PLAN:  Will await CLOtest and will treat with proton pump inhibitor.  Probably need repeat  esophagogastroduodenoscopy in 2-3 months to assess  healing and rule out malignancy if ulcer is persistent.                                               John C. Madilyn Fireman, M.D.    JCH/MEDQ  D:  11/02/2003  T:  11/02/2003  Job:  [id number]   cc:   Gregary Signs A. Everardo All, M.D. [facility]

## 2011-05-10 NOTE — Discharge Summary (Signed)
NAME:  Bethany Graham, Bethany Graham                ACCOUNT NO.:  [id number]   MEDICAL RECORD NO.:  [id number]          PATIENT TYPE:  INP   LOCATION:  4712                         FACILITY:  [facility]   PHYSICIAN:  Willa Rough, M.D.     DATE OF BIRTH:  1916-03-18   DATE OF ADMISSION:  07/18/2006  DATE OF DISCHARGE:  08/04/2006                                 DISCHARGE SUMMARY   DISCHARGE DIAGNOSES:  1. Acute posterior myocardial infarction.  2. Hypertension.  3. Congestive heart failure related to above.  4. Ischemic cardiomyopathy with ejection fraction of 45% by      echocardiogram.  Continue medical treatment.  5. Right hand hematoma.  6. Acute-on-chronic renal insufficiency.  7. Hypokalemia.  8. Orthostatic hypotension.  History as noted below.   SUMMARY OF HISTORY:  Bethany Graham is an 77 year old white female who on the day  of admission developed substernal chest discomfort at 9 a.m.  This occurred  while at rest.  Despite taking her reflux medications and eating breakfast,  her symptoms did not resolve.  Her symptoms intensified, and she called EMS.  En route to the hospital, she received a sublingual nitroglycerin which  decreased her discomfort slightly.  In the ER, she received another  sublingual nitroglycerin which improved her discomfort.  After being placed  on IV nitroglycerin, it was down to a 3 with improvement of her blood  pressure.  She felt that her symptoms were similar to before her bypass  surgery in 1998, and recalled the symptoms on the day prior to admission,  but dismissed this.  She does describe some shortness of breath and  associated diaphoresis.   PAST MEDICAL HISTORY:  1. Notable for bypass surgery in May of 1999.  2. Diabetes.  3. Hypertension.  4. Hyperlipidemia.  5. Right internal carotid artery stenosis.  6. GERD.  7. Osteoporosis.  8. Chronic right bundle branch block.  9. Varicose veins.  10.History of DVT with completed Coumadin.  11.Last adenosine  Myoview in 2001 showed an EF of 50% and inferolateral      scar but no ischemia.   LABORATORY DATA:  Admission weight was 151.  Discharge weight was 137.  Admission hemoglobin and hematocrit was 12.6 and 36.6, normal indices,  platelets 313, WBCs of 11.1.  Subsequent hematologies were essentially  unremarkable.  At the time of discharge, hemoglobin and hematocrit was 12.7  and 38.0, normal indices, platelets 314, WBCs 8.2.  Admission PTT was 135,  PT 13.9 and an INR of 1.1.  Sodium 139, potassium 3.8, BUN 27, creatinine  1.9.  Normal LFT's.  Albumin and protein were slightly low at 3.2 and 5.9  respectively.  Subsequent chemistries were essentially the same; however, on  July 21, 2006, her BUN and creatinine increased to 34 and 2.2, and on July 24, 2006, 42 and 2.0, with a gradual decline until July 31, 2006, and her  BUN and creatinine were 31 and 1.9.  At the time of discharge, BUN and  creatinine were 35 and 2.3.  Potassium 3.5 and sodium 140.  Admission CK  total was 178 with an MB of 23.9 and relative index of 13.4.  Troponin 1.47.  The second CK was 753 with an MB of 166.4, relative index 20.8 and troponin  of 12.32.  The third CK-MB was 885 and 184.9 respectively.  Relative index  of 20.9, and a troponin of 2.55.  BNP on admission was 840.  On July 28, 2006, it was greater than 3200.  Admission TSH was 2.55.  Stools were heme  negative on July 19, 2006.  Chest x-ray on July 18, 2006 showed bibasilar  atelectasis, chronic interstitial changes, large hiatal hernia.  Repeat  chest x-ray on July 21, 2006 post PICC line insertion did not show any  essential changes.  On July 24, 2006, chest x-ray showed interstitial edema  with small bilateral pleural effusions.  Echocardiogram on July 18, 2006  showed an EF of 40-45%, mid inferior akinesis, distal inferior hypokinesis,  mid distal posterior akinesis.  It was felt that the posterior changes were  new from June 2007, mild LVH, mild  MR, low end of normal RV function.  EKG  on admission showed baseline artifact and difficult-to-interpret EKG.  Subsequent EKG showed normal sinus rhythm, left axis deviation, PVC's, right  bundle branch block, left anterior fascicular block.  Subsequent EKGs were  essentially the same.   HOSPITAL COURSE:  Bethany Graham was seen initially in the emergency room by  Lavella Hammock and Dr. Dietrich Pates.  She was placed on IV heparin, as well as  nitroglycerin and metoprolol.  Dr. Dietrich Pates felt that EKGs available showed  substantial ST segment depression in V2 through V3.  It was felt that  conservative treatment would probably be pursued.  Echocardiogram was  performed as described.  The patient developed a right hand hematoma.  The  physician was notified, and Integrilin was discontinued.  By July 19, 2006,  Dr. Myrtis Ser felt that she ruled in for myocardial infarction with new posterior  akinesis on her echocardiogram.  Given her hematoma, it was felt that her  heparin and Integrilin should be discontinued.  It was felt, given her renal  insufficiency, she should not be on an ACE inhibitor.  By July 20, 2006, it  was felt that she was volume overloaded and diuresis persisted.  Beta  blockers were also held secondary to hypertension on July 21, 2006.  Dr.  Myrtis Ser spoke with Bethany Graham' niece, who maintains power of attorney.  Her  Norvasc was discontinued, given her hypotension.  With creatinine clearance  less than 30, Eplerenone was contraindicated.  Cardiac rehabilitation  assisted with education and ambulation.  Pastoral care also assisted with  support needs.  Dobutamine was started to assist with diuresis.  By July 24, 2006, she was feeling slightly better.  Exam continued to show rhonchi  and rales.  Hematoma in her right hand was gradually improving.  Her  potassium was noted to be slightly low, but within normal limits; thus, potassium was supplemented.  IV Lasix was changed to p.o., and  dobutamine  was weaned.  Clinical/social work also assisted with discharge planning, as  the patient had consented to a skilled nursing facility post discharge.  Dr.  Myrtis Ser agreed with this.  Discharge planning was begun; however, by August 02, 2006, her BUN and creatinine began increasing.  It was noted that she was  approximately 16.4 pounds down from admission.  It was felt that she was  slightly dehydrated, given her orthostatic hypotension; thus, her Lasix  was  held.  During her several days of diuresis, her activity gradually increased  with the assistance of cardiac rehabilitation.  Social work continued to  work on discharge planning, and interdisciplinary patient outcome planning  was also pursued.  By August 04, 2006, her renal status was stabilizing.  She was not having any further problems with orthostatic hypotension.  By  August 05, 2006, Dr. Myrtis Ser felt that she was improving considerably and felt  that she could be discharged home.   DISPOSITION:  The patient is asked to maintain a low-salt, fat and  cholesterol diet.  Her activity is not restricted.  Wound care is not  applicable.  She was asked to weigh daily, to bring all medicines and all  weights to all appointments.   MEDICATIONS:  Her new medications include:  1. Coreg 6.25 mg b.i.d.  2. K-Dur 10 mEq daily.  3. Spironolactone 25 mg daily.  4. She was asked to increase her aspirin to 325 mg daily.  5. She was asked to continue her calcitriol 0.25 mg t.i.d.  6. Zegerid 40 mg b.i.d.  7. Vytorin 10/40 mg q.h.s.  8. Detrol 4 mg daily.  9. Imipramine 50 mg q.h.s.  She was given permission to continue her:  1. Blephamide ointment.  2. Proctosol.  3. Darvocet-N.  4. Meclizine.  5. MiraLax.  6. Refresh Tears.  7. Estrace cream.  8. _________ as needed, as she was taking previously.   FOLLOWUP:  We will arrange a followup appointment with Dr. Myrtis Ser in the next  2 weeks at the [facility] office.  She will followup  with Dr. Everardo All as  needed.   She was given instructions not to take her Welchol, Zetia, Lasix or Norvasc.   She will be discharged to the [facility] nursing facility.   DISCHARGE TIME:  Greater than 30 minutes.      Joellyn Rued, P.A. [facility]    ______________________________  Willa Rough, M.D.    EW/MEDQ  D:  08/05/2006  T:  08/05/2006  Job:  [id number]   cc:   Gregary Signs A. Everardo All, M.D. [facility]

## 2011-05-10 NOTE — Discharge Summary (Signed)
NAME:  Bethany Graham, Bethany Graham                ACCOUNT NO.:  [id number]   MEDICAL RECORD NO.:  [id number]          PATIENT TYPE:  INP   LOCATION:  1324                         FACILITY:  [facility]   PHYSICIAN:  Stacie Glaze, MD    DATE OF BIRTH:  December 26, 1915   DATE OF ADMISSION:  01/14/2008  DATE OF DISCHARGE:  February 14, 2008                               DISCHARGE SUMMARY   DEATH SUMMARY:   ADMISSION DIAGNOSES:  1. Nausea and vomiting.  2. Right lower extremity cellulitis.  3. Urinary sepsis.  4. Chronic obstructive pulmonary disease.  5. Vertebral compression fractures.  6. End-stage renal disease.  7. Chronic anemia.   DEATH DIAGNOSES:  1. Pneumonia.  2. Stage 4 chronic obstructive pulmonary disease.  3. Hypoxemia.   HOSPITAL COURSE:  The patient was an 77 year old female patient who  presented to the primary care service with cellulitis, nausea and  vomiting, congestive heart failure, and COPD.  Was appropriately cared  for on the primary care service until it was decided that her medical  conditions were overwhelming.  In a discussion with family resulted in  the decision for palliative care.  Hospice consult was obtained on  January 22, 2008 at which time gradually decisions were made by the  family to transfer her from a medical floor to a palliative care floor,  and careful discussions about her respiratory failure due to her COPD  and end-stage lung disease with a probable pneumonia and due to her  cellulitis and due to the hospital complications of recurrent deep pain  vein thrombosis.  She was made a full DNR, and the goals of care were  spelled out in the palliative medicine note.  No artificial feeding or  hydration was desired by the family, no aggressive care, and comfort  measures were pursued.  The family remained at bedside during the rest  of her hospitalization, and careful communication with the family  confirmed each day that palliative care was the goal of this  hospitalization and that her eminent death was expected.   On Feb 14, 2008, the patient expired just past midnight at  approximately 1:00 a.m. with the niece, [name], at bedside.  She  was released to the funeral home.      Stacie Glaze, MD  Electronically Signed     JEJ/MEDQ  D:  02/23/2008  T:  02/23/2008  Job:  [id number]

## 2011-05-10 NOTE — H&P (Signed)
NAME:  Bethany Graham, Bethany Graham                          ACCOUNT NO.:  [id number]   MEDICAL RECORD NO.:  [id number]                   PATIENT TYPE:  INP   LOCATION:  0482                                 FACILITY:  [facility]   PHYSICIAN:  Sean A. Everardo All, M.D. [facility]           DATE OF BIRTH:  1916/10/12   DATE OF ADMISSION:  10/31/2003  DATE OF DISCHARGE:                                HISTORY & PHYSICAL   REASON FOR ADMISSION:  Rectal bleeding.   HISTORY OF PRESENT ILLNESS:  The patient is an 77 year old woman with 1 week  of moderate generalized, crampy, quality abdominal pain.  There is  associated rectal bleeding which the patient is completely unable to  quantify.  There is also melena which she says has been consistent for the  past few days. She also states that her chronic postural dizziness is  slightly worse.  The patient states that she last had a colonoscopy done by  Dr. Madilyn Fireman about 1-1/2 years ago.   PAST MEDICAL HISTORY:  1. Type 2 diabetes.  2. Osteoporosis.  3. Osteoarthritis.  4. Hypertension.  5. GERD.  6. Peptic ulcer disease.  7. Varicosities of the lower extremities.  8. Colonic polyps.  9. Glaucoma.  10.      Cigarette smoker, quit in 1997.  11.      Spinal stenosis.  12.      CAD with a CABG in 1999.  13.      Right carotid stenosis.  14.      DVT in 1999 for which the patient was treated with a course of     Coumadin.  15.      Postural hypotension of uncertain etiology.   MEDICATIONS:  1. Ambien 5 mg q.h.s.  2. Pletal 100 mg twice daily.  3. Imipramine 50 mg q.h.s.  4. Detrol-LA 4 mg daily.  5. Florinef 100 mcg every Monday and Friday.  6. Aspirin 81 mg daily.  7. Estrace vaginal cream q.h.s.  8. Zetia 10 mg daily.  9. Darvocet-N 100 one or two q.6h. as needed for pain.   DRUG ALLERGIES:  The patient states that all STATINS cause her to have  muscle cramps.  She has a history of cough due to ACE INHIBITOR.  She states  that she also is allergic to  PENICILLIN, SULFA, CODEINE, and ULTRAM.   SOCIAL HISTORY:  The patient lives alone and she is widowed.  No one else  close to the patient is ill.   REVIEW OF SYSTEMS:  She is uncertain if she has fever.  She has no change in  her chronic shortness of breath. She denies the following:  Weight gain,  weight loss, chest pain, cough, nausea, vomiting, dysuria, hematuria, and  skin rash.   PHYSICAL EXAMINATION:  VITAL SIGNS:  Blood pressure 220/90, heart rate is  110, temperature is 97.4.  The weight is 137.  GENERAL:  In no distress.  SKIN:  Not diaphoretic.  HEENT:  The head is atraumatic.  Sclerae nonicteric.  Pharynx is clear.  NECK:  Neck is supple.  No goiter.  CHEST:  Clear to auscultation.  No respiratory distress.  CARDIOVASCULAR:  No JVD, regular rate and rhythm, no murmur.  Carotid  arteries show no bruit.  Pedal pulses are intact bilaterally.  ABDOMEN:  Soft and nontender.  No hepatosplenomegaly.  No mass.  It is  obese.  There is no hernia.  RECTAL:  Normal sphincter tone.  There is no rectal mass and there is melena  present which is strongly Hemoccult-positive.  LYMPHADENOPATHY:  None is palpable at the neck nor at the groin.  MUSCULOSKELETAL:  Gait is observed in the office to be normal.  She has  moderate osteoarthritic changes of the extremities.  SKIN:  No rash.  PSYCHOLOGICAL:  Alert, well oriented, does not appear anxious nor depressed.   IMPRESSION:  1. Melena with rectal bleeding.  2. Hypertension. Therapy if this is limited not only by her history of     postural hypotension, but of her possible ongoing GI bleeding.  3. Other chronic medical problems as noted.   PLAN:  1. Admit to [facility].  2. Check CBC and other laboratory studies.  3. Abdominal ultrasound.  4. I discussed Code status with the patient.  She states she requests DNR.  5. GI consultation if needed.  6. Gentle control of hypertension.                                                 Sean A. Everardo All, M.D. [facility]    SAE/MEDQ  D:  10/31/2003  T:  10/31/2003  Job:  [id number]

## 2011-05-10 NOTE — Assessment & Plan Note (Signed)
[facility]                            CARDIOLOGY OFFICE NOTE   NAME:Hawks, PETRICE BEEDY                       MRN:          [id number]  DATE:11/25/2006                            DOB:          Aug 25, 1916    Bethany Graham is seen for followup. She is actually doing pretty well. She  is having some increased edema. She has some mild shortness of breath.  Otherwise she is relatively stable. She has a known history of problems  with volume overload. She also has history of mitral regurgitation.   PAST MEDICAL HISTORY:   ALLERGIES:  PENICILLIN, SULFA, CODEINE, ULTRAM, AND TETRACYCLINE.   MEDICATIONS:  Detrol LA, furosemide 40 to be increased to 40 b.i.d. for  several days, Ecotrin, Zyloprim, Vytorin, imipramine, potassium,  Carvedilol, and omeprazole.   OTHER MEDICAL PROBLEMS: See the extensive list on my note of October 23, 2006.   REVIEW OF SYSTEMS:  Other than her fluid overload she is feeling well.  She has some decreased energy. Otherwise her review of systems in  negative.   PHYSICAL EXAMINATION:  Blood pressure is 126/72, pulse 83. Her weight is  141 pounds. The patient is oriented to person, time, and place. Affect  is normal.  HEENT: Reveals no xanthelasma. She has normal extraocular motion. There  is no jugular venous distension. There is question of a soft carotid  bruits.  CARDIAC EXAM: Reveals an S1 with an S2 with a murmur of mitral  regurgitation. There is a question of a few rales.  ABDOMEN: Soft. There are no masses or bruits. She does have 1 to 2+  peripheral edema today. Her weight is increased by 3-4 pounds since her  last visit.   Problems include:  1. Coronary disease.  2. History of CABG in 1999.  3. Left lower lobe nodule followed by Dr. Sherene Sires.  4. History of dizziness which we believe is vertigo.  5. Hypertension stable.  6. Ejection fraction 40-45% after her posterior infarct earlier this      year.  7. Lipid  abnormalities on Vytorin.  8. Peripheral vascular disease followed by Dr. Woodfin Ganja.  9. Peptic ulcer disease.  10.Glaucoma.  11.Degenerative joint disease.  12.Right bundle branch block.  13.Recent in the past 6 months acute posterior myocardial infarction      with prolonged unstable period with dobutamine and eventually she      stabilized.  14.Right hand hematoma. It is now almost completely gone.  15.Renal insufficiency. This has stabilized.  16.Volume status, I believe her volume status has increased and her      Lasix dose  will be increased.  17.Potassium being followed.  18.Hypoxia, she uses home O2.  19.Muscle aches. We had talked about putting her Vytorin on hold. I      will have to recheck this issue and then complete the dictation.   Increase Lasix and I will see her in followup.     Luis Abed, MD, [facility]  Electronically Signed    JDK/MedQ  DD: 11/25/2006  DT: 11/26/2006  Job #: [id number]

## 2011-05-10 NOTE — Discharge Summary (Signed)
NAME:  Bethany Graham, Bethany Graham                          ACCOUNT NO.:  [id number]   MEDICAL RECORD NO.:  [id number]                   PATIENT TYPE:  INP   LOCATION:  0482                                 FACILITY:  [facility]   PHYSICIAN:  Rene Paci, M.D. [facility]          DATE OF BIRTH:  Sep 08, 1916   DATE OF ADMISSION:  10/31/2003  DATE OF DISCHARGE:  11/02/2003                                 DISCHARGE SUMMARY   DISCHARGE DIAGNOSES:  1. Acute gastrointestinal bleed presumed secondary to gastric ulcer, stable.  2. Anemia secondary to above status post 2 units PRBC transfusion.     Hemodynamically stable.  Discharge hemoglobin 9.7.  3. Hypertension with postural hypotension.  4. History of coronary artery disease status post coronary artery bypass     graft.  5. History of peripheral vascular disease status post left carotid     endarterectomy 1999.  6. History of diverticulitis by colon, August 2003.  7. History of osteoporosis.  8. History of osteoarthritis.   DISCHARGE MEDICATIONS:  1. Protonix 40 mg p.o. b.i.d.  2. No aspirin for two weeks.  May then resume aspirin 81 mg p.o. daily.  3. Zetia 10 mg p.o. daily.  4. Florinef 0.1 mg p.o. b.i.d.  5. Pletal 100 mg p.o. b.i.d.  6. Imipramine 50 mg p.o. q.h.s.  7. Detrol LA 4 mg p.o. daily.  8. Ambien 5 mg p.o. q.h.s. p.r.n. insomnia.  9. She is also going to try Norvasc 5 mg p.o. daily for treatment of her     hypertension.  She has tolerated this well for 48 hours while     hospitalized.   HOSPITAL FOLLOWUP:  With her GI physician, John C. Madilyn Fireman, M.D. in  approximately one month.  She is to call and schedule for appointment to  reschedule and repeat EGD for two to three months to reevaluate gastric  ulcer.  She will also contact her primary care physician, Sean A. Everardo All,  M.D. [facility] in about two to three weeks for routine follow-up on her medical  issues.   PROCEDURE:  1. Completed CT of the abdomen and pelvis which was normal without  any     evidence of colitis.  2. EGD performed day of discharge showing a clean based gastric ulcer.  CLO     test pending at time of discharge.   HOSPITAL COURSE:  Problem 1 -       ACUTE GASTROINTESTINAL BLEED:  The  patient is a pleasant 77 year old woman who presented to her primary care  physician's office day of admission complaining of rectal bleeding.  It had  begun as melena and had been changed to bright red blood per rectum.  She  was grossly heme-positive and referred for admission and evaluation of GI  bleeding.  Her hemoglobin was 8.5 on admission dropping to 7.7 the following  day and GI consult was obtained.  We performed an EGD and  found gastric  ulcer as probable source of bleed.  CT abdomen suggested no evidence of  lower GI abnormalities.  She was transfused 2 units PRBC's and had an  appropriate increase in her hemoglobin to 9.7.  She had no further rectal  bleeding and had a normal bowel movement without melena on day of discharge.  She was prescribed b.i.d. proton pump inhibitor.  No NSAIDs for two weeks.  Repeat EGD in two to three months to reevaluate ulcer.   Problem 2 - HYPERTENSION:  The patient's hypertension has always been  difficult to control due to history of postural hypotension with treatment.  She was begun on Norvasc 5 mg with modest improvement of her systolic  pressure and has significant postural hypotension symptoms reported.  She is  to recheck with her primary care physician on control of her blood pressure  and any possible side effects from the addition of Norvasc.  All other  medications are as prior to admission except as listed above.                                               Rene Paci, M.D. [facility]    VL/MEDQ  D:  11/02/2003  T:  11/02/2003  Job:  [id number]

## 2011-05-10 NOTE — H&P (Signed)
NAME:  Bethany Graham, Bethany Graham                ACCOUNT NO.:  [id number]   MEDICAL RECORD NO.:  [id number]          PATIENT TYPE:  INP   LOCATION:  1827                         FACILITY:  [facility]   PHYSICIAN:  Pricilla Riffle, MD, FACCDATE OF BIRTH:  04-Dec-1916   DATE OF ADMISSION:  08/09/2006  DATE OF DISCHARGE:                                HISTORY & PHYSICAL   IDENTIFICATION:  The patient is an 77 year old woman who presents to the  emergency room today for shortness of breath. Note, she was just discharged  August 05, 2006.   The patient has a known history of coronary artery disease. She is usually  followed by Dr. Jerral Bonito in clinic. She was admitted on July 18, 2006 with  chest pain. She ruled in for MI (posterior) with a peak troponin of 2.55, CK-  MB 88/185. BNP at peak during this admission was 3200 on August 6.  Echocardiogram on July 27 showed a LVF of 40 to 45% with inferior/posterior  akinesis (mid). Plan was for medical therapy. She was treated for  dobutamine. No ACE inhibitor because of renal insufficiency. She was  diuresed, actually over diuresed probably. Creatinine increased transiently.  Lasix was held. By August 13, creatinine stabilized, and patient discharged  home. Creatinine on discharge was 2.3.   The patient said she did okay on 15th, 16th and part of the 17th. She woke  up at about 3 a.m. very short of breath as she tried to get the bathroom. O2  saturation initially was 90, decreased to 87%. One liter of O2 given,  nebulizer x2 given. Still, patient still very short of breath. She also  noted some abdominal pain in the right lower quadrant groin. Had not had  prior. Notes bowels moving okay.   Notes her appetite has been good. She has enjoyed eating hot dogs, enjoyed  the soap like [employer]. She still stays she is tender some in the  abdomen but currently denies chest pain. Breathing is better after Lasix.   ALLERGIES:  PENICILLIN, SULFA, CODEINE, ULTRAM,  TETRACYCLINE.   MEDICATIONS AT DISCHARGE:  1. Coreg 6.25 b.i.d.  2. K-Dur 10 mEq daily.  3. Aldactone 25 daily.  4. Aspirin 325 daily.  5. Calcitriol 0.25 mg t.i.d.  6. Zegerid 40 b.i.d.  7. Vytorin 10/40 daily.  8. Detrol 4 daily.  9. Imipramine 50 every night.   P.r.n. medications:  1. Darvocet-N.  2. Meclizine.  3. MiraLax.  4. Estrace cream.  5. Refresh Tears.  6. Proctosol.  7. Blephamide ointment.   PAST MEDICAL HISTORY:  1. CAD status post CABG May 1999.  2. Diabetes.  3. Hypertension.  4. Dyslipidemia.  5. Right ICA stenosis.  6. GE reflux/PUD.  7. Osteoporosis.  8. Right bundle branch block.  9. History of DVT.  10.History of lung nodule followed by Dr. Sherene Sires.  11.Dizziness, question vestibular.  12.PVOD followed by Dr. Edilia Bo.  13.Glaucoma.  14.Right hand hematoma on last admit.  15.Renal insufficiency, creatinine 1.9 to 2.3.  16.Hypoxia on home O2.   PAST SURGICAL HISTORY:  1. CABG.  2.  Back surgery x4.  3. TNA.  4. Cataract.   SOCIAL HISTORY:  The patient lives in [place] [facility] [place] [facility] Has a 20-pack-  year history of tobacco; quit 30 years ago. Does not drink.   FAMILY HISTORY:  Mother died of CHF, age 39, had rheumatoid arthritis.  Father died age 64, does not appear heart. Sister died, question CAD.   REVIEW OF SYSTEMS:  All systems reviewed. Negative to the above problem list  except as noted above.   PHYSICAL EXAMINATION:  GENERAL:  The patient is breathing better after  Lasix. Denies chest pain.  VITAL SIGNS:  Blood pressure 122/66, pulse 92, temperature afebrile, O2  saturation on arrival 82% 3 liters.  HEENT:  Normocephalic, atraumatic. PERRL.  NECK:  JVP is normal now. No bruits.  LUNGS:  Crackles bilaterally one third up.  CARDIAC:  Regular rate and rhythm. S1 and S2. No definite S3. Grade 1-2/6  systolic murmur heard best at the apex.  ABDOMEN:  Mild mid abdominal tenderness. No masses. No rebound. Normal bowel  sounds.   EXTREMITIES:  Feet cool. No edema. Trace dorsalis pedis pulses.   LABORATORY DATA:  Significant for a hemoglobin of 12.6, WBC of 10.5,  platelets 312. BUN and creatinine of 30 and 2.2, potassium 4.1. Troponin  0.06, CK-MB of 1.7. Chest x-ray shows vascular congestion, bilateral pleural  effusions (small). EKG:  Sinus rhythm, 93 beats per minute, right bundle  branch block, inferior/posterior MI.   IMPRESSION:  The patient is an 77 year old woman with known CAD status post  CABG 1999, recent admission, just discharged August 14 with an  anterior/posterior MI, plan for medical therapy given medical issues and  age. Note, ejection fraction on admission was 40 to 45%. She was doing okay  at the nursing home facility up until last evening. She was eating some hot  dogs and soup she noted. This morning at about 4 a.m., began having  shortness of breath, presented to the ER after unsuccessful treatment. Note  exam with some volume increase has responded some to Lasix. JVP normal but  still crackles bilateral lungs.   PLAN:  Give additional Lasix today. Watch BUN and creatinine, hold  __________ except for aldactone. Question etiology of exacerbation. Question  if the patient had some increased salt loading in the nursing home with new  diet in the setting of renal insufficiency. She also is on oxygen. Hypoxia  may have exacerbated above problem. May indeed have been multifactorial.   Otherwise, low sodium diet. Continue other medications. Contact PT tomorrow  for continued rehabilitation for continued strengthening.   Watch blood pressure. Continue Vytorin.           ______________________________  Pricilla Riffle, MD, [facility]     PVR/MEDQ  D:  08/09/2006  T:  08/09/2006  Job:  [id number]

## 2011-05-10 NOTE — Assessment & Plan Note (Signed)
HEALTHCARE                              CARDIOLOGY OFFICE NOTE   NAME:Graham Graham SPELLMAN                       MRN:          [id number]  DATE:10/23/2006                            DOB:          January 08, 1916    Graham Graham is seen for cardiology followup.  She is remaining stable.  She  had a very rocky course while recently hospitalized.  Since being out we  have adjusted her medicines and she appears to be stable.  She has some mild  dizziness.  I believe that this is vertigo and I have given her permission  to use the meclizine that had been given to her in the past.  She has some  overall shortness of breath and fatigue that goes along with her overall  cardiac status and this has not changed.  She is developing some muscle  aches which are new.  I am concerned that this might be related to her  statin and therefore her Vytorin will be held.  She had been on Zetia in the  past but it was changed to Vytorin, I believe in September 2007.  Otherwise,  she is doing somewhat better.   PAST MEDICAL HISTORY:  Allergies:  PENICILLIN, SULFA, CODEINE, ULTRAM, and  TETRACYCLINE.   Medications:  1. Detrol LA.  2. Furosemide 40.  3. Ecotrin 325.  4. Vytorin 10/40 (to be held).  5. Imipramine.  6. Potassium.  7. Carvedilol 6.25 b.i.d.  8. Omeprazole.   Other medical problems:  See the extensive list below.   REVIEW OF SYSTEMS:  We talked about her dizziness.  Also, we talked about  her muscle aches per the HPI.  Otherwise, her review of systems is negative  today.   PHYSICAL EXAMINATION:  VITAL SIGNS:  The patient weighs 138 pounds and this  is similar to the weight that she has had in the past.  Blood pressure is  118/70 with a pulse of 72.  GENERAL:  The patient is well-nourished.  She is oriented to person, time  and place.  Her affect is normal.  She is a slight elderly woman but she is  active and has a good attitude.  HEENT:  Reveals no  xanthelasma.  She has normal extraocular motion.  No  carotid bruits are heard.  There is no jugular venous distention.  CARDIAC:  Reveals an S1 with an S2 and a murmur of mitral regurgitation.  LUNGS:  Do reveal her chronic rales.  There is no respiratory distress.  ABDOMEN:  Soft.  She has no masses or bruits.  EXTREMITIES:  She has no significant peripheral edema today.  The area of  swelling in her right hand continues to improve and is almost completely  gone at this time.  She has no significant peripheral edema at this time.  She does have kyphosis.   PROBLEMS:  1. Coronary disease.  2. History of coronary artery bypass grafting in 1999.  3. Left lower lobe nodule followed by Dr. Sherene Sires.  4. History of dizziness.  We believe that it is a form  of vertigo and she      can use her meclizine.  5. Hypertension, stable.  6. Ejection fraction 40-45% after her posterior myocardial infarction this      year.  7. Lipid abnormalities.  She had been on Vytorin.  This will be held today      because of muscle aches.  8. Peripheral vascular disease, followed by Dr. Cari Caraway.  When I see      her next we will look into the next step in this regard.  9. History of peptic ulcer disease.  10.Glaucoma.  11.Degenerative joint disease.  12.Right bundle-branch block.  13.Recent acute posterior myocardial infarction with a prolonged unstable      period.  Dobutamine was required.  She eventually stabilized nicely.  14.Right hand hematoma.  This is now almost completely resolved.  15.Renal insufficiency.  Her most recent BMET on September 23, 2006, revealed      her BUN was 33 and creatinine 2.5 and this in fact appears to be stable      for her.  16.Volume status.  I believe that her current volume status is stable on      40 mg of Lasix daily.  17.Potassium. This is treated well and her potassium was 4.5 on September 23, 2006.  18.Hypoxia with lung disease.  She uses oxygen at home.  19.New  problem - muscle aches.  I am concerned this might be related to      the Vytorin.  This will be held and she will call us and let us know      how she is doing, but I will also see her back in 3 weeks to see the      progress and make any further adjustments.    ______________________________  Luis Abed, MD, [facility]    JDK/MedQ  DD: 10/23/2006  DT: 10/23/2006  Job #: [id number]

## 2011-05-10 NOTE — Assessment & Plan Note (Signed)
[facility] HEALTHCARE                              CARDIOLOGY OFFICE NOTE   NAME:Bethany Graham, Bethany Graham                       MRN:          [id number]  DATE:09/08/2006                            DOB:          04-Sep-1916    Bethany Graham is doing very well.  Bethany Graham recently had a rocky course in that  she was hospitalized with an MI and went home and came back in volume  overloaded and then was discharged again.  She was seen in the office on  August 26, 2006 by Wende Bushy.  She appeared to be stable at that  time.  She had mild orthostatic blood pressure drops, and her Lasix dose was  reduced 40 mg daily from b.i.d.  Since that time, she has done well.  She  has some mild dizziness.  She continues to wonder if this is an inner ear  problem.  We know that she has some vertigo.  She has not had syncope or pre-  syncope.  She has had no chest pain.  She is progressing and hopes to go  home from the nursing home soon.  She has had some renal insufficiency.  Overall, she is doing well.  Most recent BUN was 42, but her creatinine was  up to 2.6, and this is dated August 26, 2006, and that was the day her  Lasix was changed, and we will repeat this today.   PAST MEDICAL HISTORY:  Allergies:  PENICILLIN, SULFA, CODEINE, ULTRAM AND  TETRACYCLINE.   MEDICATIONS:  1. Coreg 6.25.  2. Aspirin.  3. Vytorin 10/40.  4. Calcitrol.  5. Detrol LA.  6. Tofranil.  7. Potassium 20 mEq daily.  8. Lasix 40 mEq daily.  9. Prilosec.  10.MiraLax.  11.Blephamide one weekly.   OTHER MEDICAL PROBLEMS:  See the list below.   REVIEW OF SYSTEMS:  She is doing well.  Her mild dizziness is the major  issue.  Otherwise, her review of systems is negative.   PHYSICAL EXAMINATION:  VITAL SIGNS:  Blood pressure is 130/83 with a pulse  of 82.  GENERAL:  Patient is oriented to person, time and place, and her affect is  normal.  LUNGS:  Are clear.  Respiratory effort is not labored.  HEENT:  Reveals no xanthelasma.  She has normal extraocular motion.  There  are no carotid bruits.  There is no jugular venous distention.  CARDIAC:  Reveals her murmur of mitral regurgitation.  ABDOMEN:  Soft.  She has no significant peripheral edema today.  EXTREMITIES:  Patient has a hematoma on the back of her right hand.  This  continues to resolve since her hospitalization, but it is still present.   PROBLEM:  1. Coronary disease.  2. History of CABG in 1999.  3. Left lower nodule seen in her lung followed by Dr. Sherene Sires over time.  We      did not see major changes in the hospital, and she will not follow up      with Dr. Sherene Sires in the near future, but I will arrange  it in the more      distant future.  4. History of dizziness.  She does have some old vestibular problems.  She      receives some education about this while in the hospital.  We can      evaluate her further at a later date.  5. Hypertension, stable.  6. Ejection fraction 40-45%, after her posterior MI in the hospital      recently.  7. Lipid abnormalities.  8. Peripheral vascular disease followed by Dr. Cari Caraway.  We will      postpone his followup for another 6 months.  9. History of peptic ulcer disease in the past.  10.Glaucoma.  11.Degenerative joint disease.  12.Right bundle branch block.  13.Recent acute posterior MI.  She required Dobutamine for a while.  It is      stabilized, but now she is much better.  14.Right hand hematoma.  This continues to resolve.  15.Renal insufficiency.  Most recent creatinine up to 2.6.  Her baseline      is in the 1.9 range, and a BMET will be checked today.  16.Volume status.  When she first went home from the hospital, she became      volume overloaded and had to be diuresed and went home on 80 of Lasix.      She is now on 40 and appears to be remaining stable on 40.  17.Potassium.  Her potassium was low, and she is on replacement and BMET      is to be checked today.   18.Hypoxia with lung disease, and she is using oxygen at the nursing      center.   Overall, I am very pleased with her overall progress.  She is a very nice  lady and appears to be stable.  BMET will be checked today, and I will see  her back in 6 weeks.  We will move Dr. Adele Dan appointment.  We will re-  schedule Dr. Sherene Sires at a later date.  We will assess her vertigo  further at a later date, and she is allowed to go home from the nursing  home, when her time there is completed.                               Luis Abed, MD, [facility]    JDK/MedQ  DD:  09/08/2006  DT:  09/09/2006  Job #:  [id number]   cc:   [facility] A. Everardo All, MD  Di Kindle. Edilia Bo, M.D.

## 2011-05-10 NOTE — Discharge Summary (Signed)
NAMECYNAI, Bethany Graham                ACCOUNT NO.:  [id number]   MEDICAL RECORD NO.:  [id number]          PATIENT TYPE:  INP   LOCATION:  4710                         FACILITY:  [facility]   PHYSICIAN:  Luis Abed, MD, FACCDATE OF BIRTH:  October 21, 1916   DATE OF ADMISSION:  08/09/2006  DATE OF DISCHARGE:                                 DISCHARGE SUMMARY   PRIMARY CARDIOLOGIST:  Luis Abed, MD, [facility].   PRINCIPAL DIAGNOSIS:  Acute on chronic congestive heart failure.   SECONDARY DIAGNOSES:  1. Ischemic cardiomyopathy, ejection fraction 45%.  2. Coronary artery disease status post coronary artery bypass graft in May      1999.  3. Type 2 diabetes mellitus.  4. Hypertension.  5. Hyperlipidemia.  6. Right internal carotid artery stenosis.  7. Gastroesophageal reflux disease/peptic ulcer disease.  8. Osteoporosis.  9. Right bundle branch block.  10.History of deep venous thrombosis.  11.History of lung nodule followed by Dr. Sherene Sires.  12.Dizziness with a question of vestibular dysfunction.  13.Peripheral vascular disease followed by Dr. Edilia Bo.  14.Glaucoma.  15.Right hand hematoma on last admission.  16.Renal insufficiency with a creatinine of 1.9 to 2.3.  17.Hypoxia on home O2.  18.History of cataracts.  19.History of back surgery x4.  20.History of tonsillectomy and adenoidectomy.   ALLERGIES:  PENICILLIN, SULFA, CODEINE, ULTRAM and TETRACYCLINE.   PROCEDURE:  None.   HISTORY OF PRESENT ILLNESS:  An 77 year old white female who was recently  admitted to [facility] July 18, 2006 with non ST elevation MI with an  echocardiogram during that admission showing and EF of 45-50% with inferior  and posterior akinesis. She was managed with medical therapy and was  subsequently discharged home on August 13. She did well for about 3 days but  following a meal including soup and hotdogs, she had acute on dyspnea and  subsequently presented back to the ED on August 18. In the ED, she  had  crackles 1/3 of the way up bilaterally and was felt to be in acute on  chronic heart failure and was admitted for further evaluation.   HOSPITAL COURSE:  She was initiated on IV Lasix therapy with brisk diuresis.  Secondary to renal insufficiency, her spironolactone and ACE inhibitor have  been discontinued. She has been maintained on beta blocker therapy. Her IV  diuresis has been switched over to Lasix 40 mg p.o. b.i.d. and she has been  doing well. The plan is for discharge back to [facility] in satisfactory  condition.   DISCHARGE LABORATORY DATA:  Hemoglobin 12.6, hematocrit 37.5, WBC 10.5,  platelets 312. PT 13.9, INR 1.1. Sodium 140, potassium 3.8, chloride 98, CO2  34, BUN 36, creatinine 2.2, glucose 94, calcium 9.4. Total bilirubin 0.9,  alkaline phosphatase 55, AST 14, ALT 11. Total protein 5.9, albumin 2.9.  Cardiac markers revealed a CK of 33, MB of 2.5, troponin I of 0.26. Her BNP  on admission was greater than 3200 and as of August 23, it was 2446.0.   DISPOSITION:  The patient will be discharged back to [facility] in good  condition.   PLAN:  She has a followup appointment with Dr. Willa Rough on September 4  at 9:15 a.m.   DISCHARGE MEDICATIONS:  1. Coreg 6.25 mg b.i.d.  2. Aspirin 325 mg daily.  3. Calcitriol 0.25 mg t.i.d.  4. Vytorin 10/40 mg daily.  5. Detrol LA 4 mg daily.  6. Tofranil 50 mg q.h.s.  7. K-Dur 10 mEq daily.  8. Lasix 40 mg b.i.d.  9. Blephamide 0.2% opthalmic ointment p.r.n.  10.Nitroglycerin 0.4 mg sublingual p.r.n. chest pain.   OUTSTANDING LAB STUDIES:  None.   DURATION OF DISCHARGE ENCOUNTER:  45 minutes including physician time.     ______________________________  Nicolasa Ducking, ANP    ______________________________  Luis Abed, MD, [facility]    CB/MEDQ  D:  08/15/2006  T:  08/15/2006  Job:  [id number]

## 2011-05-10 NOTE — Assessment & Plan Note (Signed)
[facility]                            CARDIOLOGY OFFICE NOTE   NAME:Graham, Bethany AERTS                       MRN:          [id number]  DATE:02/24/2007                            DOB:          1916/08/08    Bethany Graham is dong remarkably well.  She fell recently but this was not  syncope.  She also has an area of discomfort on her finger that is  mentioned below.  She is not having any chest pain or marked shortness  of breath.   PAST MEDICAL HISTORY:  ALLERGIES:  SULFA, PENICILLIN, CODEINE, ULTRAM,  AND TETRACYCLINE.   MEDICATIONS:  1. Detrol LA.  2. Furosemide 40 b.i.d.  3. Ecotrin.  4. Imipramine.  5. Omeprazole.  6. Carvedilol.  7. Zyloprim.  8. Potassium.  9. Meclizine.  10.Fish oil.  11.Vitamin C.   OTHER MEDICAL PROBLEMS:  See the complete list on my note of November 25, 2006.   REVIEW OF SYSTEMS:  She is bothered by an area on her finger.  I suspect  she may have had a small infection in this area that is now healed and  needs more time to resolve.   PHYSICAL EXAMINATION:  Blood pressure today is 120/74 with a pulse of  66.  The patient is oriented to person, time, and place.  Affect is  normal.  There is no xanthelasma.  There is normal extraocular motion.  There are no carotid bruits.  There is no jugular venous distention.  CARDIAC:  Reveals an S1 with an S2.  She does have a systolic murmur.  ABDOMEN:  Soft.  There are no masses or bruits.  She has no peripheral  edema today.   PROBLEMS:  Listed in my note of November 25, 2006.   1. Volume status.  This is currently stable on the current dose of      diuretics.  She did have followup BMET done on December 29, 2006      after she had diuresed and her creatinine had improved from 2.5 to      1.4.  She does not need any labs done today.   Last problem, area on her finger that is resolving.  She will see  dermatology if it does not improve.     Bethany Abed, MD, [facility]  Electronically Signed    JDK/MedQ  DD: 02/24/2007  DT: 02/24/2007  Job #: [id number]

## 2011-05-10 NOTE — H&P (Signed)
NAME:  Bethany Graham, Bethany Graham                ACCOUNT NO.:  [id number]   MEDICAL RECORD NO.:  [id number]          PATIENT TYPE:  EMS   LOCATION:  MAJO                         FACILITY:  [facility]   PHYSICIAN:  Bethany Graham, M.D. LHCDATE OF BIRTH:  October 15, 1916   DATE OF ADMISSION:  07/18/2006  DATE OF DISCHARGE:                                HISTORY & PHYSICAL   PRIMARY CARE PHYSICIAN:  Bethany Graham, M.D.   PRIMARY CARDIOLOGIST:  Bethany Graham, M.D.   CHIEF COMPLAINT:  Chest pain.   HISTORY OF PRESENT ILLNESS:  Bethany Graham is an 77 year old female with a  history of coronary artery disease.  She had onset of substernal chest pain  at approximately 9 a.m. today.  The pain started without significant  exertion.  She stated that she took her reflux medication and a little while  later ate breakfast, but her symptoms did not resolve.  Her chest pain  reached a 10/10.  She called EMS and received sublingual nitroglycerin en  route to the hospital.  Her chest pain decreased slightly and then in the  emergency room she received more sublingual nitroglycerin and her pain  improved.  Upon initial assessment she was complaining of pain at a 6 or  7/10 but after IV nitroglycerin and Lopressor it is currently a 3/10.  Her  blood pressure has also improved.   Bethany Graham states that this pain is similar to the symptoms that she had  before her bypass surgery in 1998.  She states that she had similar symptoms  yesterday as well that started without significant exertion, but that  resolved after about 15 minutes and she did not think any more about it.  She gets some associated shortness of breath with this and had diaphoresis  after taking sublingual nitroglycerin but has not otherwise been diaphoretic  and denies nausea and vomiting.  Of note, she has also had regular problems  with alternating constipation and diarrhea for which Bethany Graham at one point  but her on MiraLax, but she quit taking this quite  awhile ago.   PAST MEDICAL HISTORY:  1.  Status post aortocoronary bypass surgery in [id number].  2.  Diabetes.  3.  Hypertension.  4.  Hyperlipidemia.  5.  Family history of coronary artery disease.  6.  History of right internal carotid artery stenosis.  7.  Gastroesophageal reflux disease/peptic ulcer disease.  8.  Osteoporosis.  9.  History of a right bundle branch block.  10. History of varicose veins  11. History of DVT (completed Coumadin).  12. Status post adenosine Myoview in 2001 with an EF of 50% and scar in the      inferior lateral region but no evidence for ischemia.   SURGICAL HISTORY:  She is status post cardiac catheterization as well as  bypass surgery.  She has had three EGD and a colonoscopy.  She has had four  back surgeries, tonsillectomy and cataract surgery.   ALLERGIES:  Se is allergic or intolerant to PENICILLIN, SULFA, CODEINE,  ULTRAM and TETRACYCLINE.   CURRENT MEDICATIONS:  1.  Zetia 10  mg a day.  2.  Zegerid 40 mg b.i.d.  3.  Imipramine 50 mg daily.  4.  Cilostazol 100 mg b.i.d.  5.  Calcitriol 0.25 mcg t.i.d.  6.  Detrol LA 4 mg daily.  7.  Darvocet p.r.n.  8.  Lasix 20 mg a day.  9.  Norvasc 5 mg a day.  10. Welchol 625 mg 3 tablets a day.  11. Occasional Ambien 5 mg q.h.s.   SOCIAL HISTORY:  She lives alone in [place].  She is retired.  She has  approximately a 20 pack-year history of tobacco use and quit 30 years ago.  She does not abuse alcohol or drugs.   FAMILY HISTORY:  Her mother died at age 66.  She had rheumatoid arthritis  but also had dropsy and fluid around her heart.  Her father died at age 15  and she cannot ascribe a specific cause to his death but does not think he  had heart disease.  She has one sister that died and had questionable heart  disease but definitely had a pacemaker and one brother that it was felt died  of an MI although he was just found dead.   REVIEW OF SYSTEMS:  She has not had any fevers or chills.   Her weight has  not changed.  She has problems with reflux symptoms occasionally as well as  constipation and diarrhea.  She had diarrhea yesterday and apparently has  constipation today.  She has occasional aches and pains in her bones and  joints.  She has problems with urinary incontinence.  Review of systems is  otherwise negative.   PHYSICAL EXAMINATION:  VITAL SIGNS:  Temperature is 97.5, initial blood  pressure 155/77, heart rate 95, respiratory rate 22, O2 saturation 96% 2 L.  GENERAL:  She is a well-developed, elderly white female in no acute  distress.  HEENT:  Her head is normocephalic and atraumatic with extraocular movements  intact.  Sclerae clear.  Nares without discharge.  NECK:  There is no lymphadenopathy, thyromegaly, bruit or JVD noted.  CARDIOVASCULAR:  Her heart is regular in rate and rhythm with an S1 and S2  and no significant murmur, rub or gallop is noted.  LUNGS:  She has decreased breath sounds in the bases but no crackles or  wheezes are noted.  SKIN:  No rashes or lesions are noted.  ABDOMEN:  Soft and has slight midline tenderness in the middle area but no  guarding, and active bowel sounds are present.  EXTREMITIES:  There is no cyanosis, clubbing or edema.  MUSCULOSKELETAL;  There is no joint deformity or effusions and no spine or  CVA tenderness.  NEUROLOGIC:  She is alert and oriented, cranial nerves II-XII grossly  intact.   Chest x-ray is pending at the time of dictation.  EKG initially was  concerning for ischemia with right bundle branch block as seen on previous  EKGs but ST depression in V1 through V3 that was different when compared  with an EKG date 2004.  These ST changes have improved somewhat as her pain  has improved.   LABORATORY VALUES:  Hemoglobin 16.3, hematocrit 48.  Sodium 138, potassium  3.7, chloride 104, BUN 28, creatinine 1.9, glucose 93.  IMPRESSION:  Bethany Graham is an elderly woman who is now 9 years status post  coronary  artery bypass graft surgery, who presents with acute coronary  syndrome.  She improved after sublingual and IV nitroglycerin.  She will be  started on IV heparin and given IV metoprolol.  Her initial troponin is in  the indeterminate range.  With her substantial ST depression, she will be  given additional antiplatelet agents per protocol.  Serial cardiac markers  will be followed as well as her EKG.  She will be hydrated and her renal  function will be followed carefully.  Will follow her over the next few days  and consider coronary angiogram as well as percutaneous intervention if she  does not improve, but conservative therapy is also a consideration if her  symptoms resolve due to her advanced age.   Greater than three-quarters of an hour was spent at the bedside  administering the medications and monitoring responses.      Theodore Demark, P.A. [facility]      [place] Graham, M.D. [facility]  Electronically Signed    RB/MEDQ  D:  07/18/2006  T:  07/18/2006  Job:  [id number]

## 2011-05-10 NOTE — Assessment & Plan Note (Signed)
[facility]                              CARDIOLOGY OFFICE NOTE   NAME:Bethany Graham, Bethany Graham                       MRN:          [id number]  DATE:08/26/2006                            DOB:          1916-03-07    This is an 77 year old widowed white female patient who is seen post  hospital after being admitted to  [facility] for congestive heart failure  after eating a meal of soup and hot dogs.  The patient recently had a non-ST-  elevation MI on July 27 and was treated medically at that time.  Ejection  fraction 45%-50% with inferior and posterior akinesis.  She was discharged  home on August 13, but readmitted August 18 with heart failure.  The patient  diuresed easily in the hospital and was sent home.  Due to renal  insufficiency, her spironolactone and ACE inhibitor were discontinued in the  hospital and she was placed on Lasix 40 b.i.d.  Since she has been home, she  denies any shortness of breath or chest pain.  She does say she has some  dizziness when she is up moving around, and blood pressure today in the  office is low.  We are currently checking orthostatics on her.   CURRENT MEDICATIONS:  1. Coreg 6.25 mg b.i.d.  2. Aspirin 325 daily.  3. Calcitriol 0.25 t.i.d.  4. Vytorin 10/40 daily.  5. Detrol 4 mg daily.  6. Tofranil 50 mg q.h.s.  7. K-Dur 10 mEq daily.  8. Lasix 40 mg b.i.d.  9. Calcium 500 b.i.d.   PHYSICAL EXAMINATION:  GENERAL: This is a very young-looking 77 year old  white female in no acute distress.  VITAL SIGNS: Blood pressure 92/60, pulse 80, weight 136.  NECK: Without JVD, HJR, bruit, or thyroid enlargement.  LUNGS: Clear anterior, posterior and lateral.  HEART: Regular rate and rhythm at 80 beats per minute, normal S1 and S2.  No  significant murmur heard.  ABDOMEN: Soft without organomegaly, masses, lesions, or abdominal  tenderness.  EXTREMITIES: Lower extremities without cyanosis, clubbing, or edema.  She  has good distal pulses.  She does have a significant-sized hematoma on her  right anterior hand, but it is much smaller than it had been secondary to an  IV stick.   IMPRESSION:  1. Congestive heart failure secondary to dietary indiscretion, resolved.  2. Ischemic cardiomyopathy, ejection fraction 45%, compensated.  3. Coronary artery disease, status post coronary artery bypass graft in      May of 1999 with non-ST-elevation myocardial infarction on July 18, 2006, treated medically.  4. Dizziness - suspect secondary to overdiuresis.  5. Diabetes mellitus.  6. Hypertension.  7. Hyperlipidemia.  8. Right internal carotid artery stenosis.  9. Gastroesophageal reflux disease and peptic ulcer disease.  10.Osteoporosis.  11.Right bundle-branch block.  12.History of deep venous thrombosis.  13.History of lung nodule, followed by Dr. Sherene Sires.  14.History of dizziness with question of vestibular dysfunction.  15.Peripheral vascular disease, followed by Dr. Edilia Bo.  16.Glaucoma.  17.Chronic renal insufficiency - creatinine 1.9 to 2.3.   PLAN:  At this time, I will decrease her Lasix to 40 mg a day.  We will  check a BMET today.  I have ordered a 2-gram sodium, modified fat diet at  [facility], and I have ordered orthostatic blood pressures  to be checked there as well.  She will follow up with Dr. Myrtis Ser in 1 month's  time.  Please see chart for details on orthostatics.                                  Jacolyn Reedy, PA-C                              Veverly Fells. Excell Seltzer, MD   ML/MedQ  DD:  08/26/2006  DT:  08/26/2006  Job #:  [id number]   cc:   Luis Abed, MD, [facility]  Bertram Millard. Hyacinth Meeker, M.D.

## 2011-05-10 NOTE — H&P (Signed)
[facility]  Patient:    Bethany, Graham Visit Number: [id number] MRN: [id number]          Service Type: Attending:  Duke Salvia. Marcelle Overlie, M.D. Dictated by:   Duke Salvia. Marcelle Overlie, M.D. Adm. Date:  03/04/02                           History and Physical  CHIEF COMPLAINT:              Post menopausal bleeding.  HISTORY OF PRESENT ILLNESS:   An 77 year old G0 P0 was referred to me recently, having some vaginal bleeding.  She does use topical periurethral estrogen daily per Dr. Annabell Howells for some UTI symptoms.  Her exam in the office was unremarkable, but due to cervical stenosis biopsy could not be performed.  Ultrasound was done showing a uterus 7.2 x 4.2 x 3.4 with a 3.3 mm endometrial stripe, but there was some fluid noted in the cavity and multiple small fibroids with several small calcifications.  Primarily due to the fluid collection inside the uterus, decision made to proceed with a D&C.  This procedure including risks of bleeding, infection, perforation that may require open or additional surgery all reviewed with her, which she understands and accepts.  PAST MEDICAL HISTORY:  ALLERGIES:                    SULFA, CODEINE, PENICILLIN.  CURRENT MEDICATIONS:           1. Ambien h.s. p.r.n.                                2. Pepcid for GERD.                                3. Darvocet as needed for pain.                                4. Pletal b.i.d.                                5. Lescol to lower her cholesterol.                                6. Imipramine for bladder symptoms.                                7. Detrol daily.                                8. Caltrate daily.                                9. Estrace cream topical.                               10. Baby aspirin once daily.  11. Florinef Acetate 0.1 mg Monday, Wednesday,                                   Friday.  OPERATIONS:                   She has  had four back surgeries, five heart bypasses, tonsillectomy, cataract surgery.  SOCIAL HISTORY:               Quit smoking in 1977.  MEDICAL:                      Her medical doctor saw her February 12, 2002 and she was cleared for surgery; that is Dr. Jerral Bonito.  PHYSICAL EXAMINATION:  VITAL SIGNS:                  Temperature 98.2, blood pressure 180/100.  LUNGS:                        Clear.  HEENT:                        Unremarkable.  CARDIOVASCULAR:               Regular rate and rhythm without murmurs, rubs, or gallops noted.  BREASTS:                      Without masses.  ABDOMEN:                      Soft, flat, and nontender.  PELVIC:                       Vulva, vagina normal.  Uterus midposition, normal size.  Adnexa negative.  EXTREMITIES AND NEUROLOGIC:   Unremarkable.  IMPRESSION:                   Limited post menopausal bleeding, evidence or fluid collection on ultrasound with an endometrial stripe of 3.3 mm.  PLAN:                         D&C for endometrial sampling.  Procedure and risks discussed as above.Dictated by:   Duke Salvia. Marcelle Overlie, M.D. Attending:  Duke Salvia. Marcelle Overlie, M.D. DD:  03/01/02 TD:  03/01/02 Job: [id number]

## 2011-05-10 NOTE — Discharge Summary (Signed)
NAME:  Bethany Graham, Bethany Graham                ACCOUNT NO.:  [id number]   MEDICAL RECORD NO.:  [id number]          PATIENT TYPE:  INP   LOCATION:  4710                         FACILITY:  [facility]   PHYSICIAN:  Gene Serpe, PA-C       DATE OF BIRTH:  26-Mar-1916   DATE OF ADMISSION:  08/09/2006  DATE OF DISCHARGE:  08/16/2006                                 DISCHARGE SUMMARY   ADDENDUM:  Discharge summary job # from 2 days ago is [id number].   At the time of discharge, Dr. Lewayne Bunting recommended that the patient have  a follow-up BMET in 1 week.  This can be done at time of her scheduled  follow-up with Dr. Myrtis Ser on September 4.           ______________________________  Rozell Searing, PA-C     GS/MEDQ  D:  08/17/2006  T:  08/18/2006  Job:  [id number]

## 2011-05-10 NOTE — Op Note (Signed)
   Bethany Graham, Bethany Graham                         ACCOUNT NO.:  [id number]   MEDICAL RECORD NO.:  [id number]                   PATIENT TYPE:  AMB   LOCATION:  ENDO                                 FACILITY:  [facility]   PHYSICIAN:  Barrie Folk, M.D.                  DATE OF BIRTH:  [birth year]   DATE OF PROCEDURE:  08/04/2002  DATE OF DISCHARGE:                                 OPERATIVE REPORT   PROCEDURE:  Colonoscopy with polypectomy.   INDICATIONS:  History of adenomatous colon polyps, due for colonoscopy  within the next year.  She has been complaining of increased abdominal pain  and diarrhea and requested to proceed with colonoscopy at this time.   DESCRIPTION OF PROCEDURE:  The patient was placed in the left lateral  decubitus position and placed on the pulse monitor with continuous low-flow  oxygen delivered by nasal cannula.  She was sedated with 50 mg IV Demerol  and 5 mg IV Versed.  The Olympus video colonoscope was inserted into the  rectum and advanced to the vicinity of the cecum but despite multiple  position changes, abdominal pressure, and torquing maneuvers, I could not  reach the cecum beyond the ileocecal valve, and this area was not well-  inspected.  Also, the prep in the right colon was generally poor.  In  addition to not being able to visualize the cecum directly, I could not rule  out small lesions less than 1 cm within the ascending colon.  Otherwise,  those areas appeared normal as best could be viewed.  In the proximal  transverse colon there was seen a round, sessile, possibly 1 cm polyp, which  was removed by snare.  The rest of the transverse colon appeared normal.  In  the descending and sigmoid colon there were some scattered diverticula and  no other abnormalities.  The rectum appeared normal, and retroflexed view of  the anus revealed no obvious internal hemorrhoids.  The colonoscope was then  withdrawn and the patient returned to the recovery room in  stable condition.  She tolerated the procedure well, and there were no immediate complications.   IMPRESSION:  1. Transverse colon polyp.  2. Incomplete imaging of the right colon and cecum.  3. Left-sided diverticulosis.   PLAN:  Await histology and will consider repeat colonoscopy in five years,  but will consider ending colon cancer screening as well based on her  advanced age.                                               Barrie Folk, M.D.    JCH/MEDQ  D:  08/04/2002  T:  08/06/2002  Job:  [id number]

## 2011-05-10 NOTE — Op Note (Signed)
NAME:  Bethany Graham, Bethany Graham                          ACCOUNT NO.:  [id number]   MEDICAL RECORD NO.:  [id number]                   PATIENT TYPE:  AMB   LOCATION:  ENDO                                 FACILITY:  [facility]   PHYSICIAN:  John C. Madilyn Fireman, M.D.                 DATE OF BIRTH:  April 20, 1916   DATE OF PROCEDURE:  01/09/2004  DATE OF DISCHARGE:                                 OPERATIVE REPORT   PROCEDURE:  Esophagogastroduodenoscopy.   INDICATIONS:  History of gastric ulcer with bleeding two months ago.   DESCRIPTION OF PROCEDURE:  The patient was placed in the left lateral  decubitus position and placed on the pulse monitor with continuous low-flow  oxygen delivered by nasal cannula.  She was sedated with 25 mg IV fentanyl  and 4 mg IV Versed.  The video endoscope was advanced under direct vision  into the oropharynx and esophagus.  The esophagus was slightly tortuous but  of normal caliber, the squamocolumnar line at 34 cm above a 4 cm hiatal  hernia.  There was no visible esophagitis, ring, stricture, or other  abnormalities at GE junction or distal esophagus.  The stomach was entered  and a small amount of liquid secretions were suctioned from the fundus.  Retroflexed view of the cardia confirmed the hiatal hernia and was otherwise  unremarkable.  The lesser curvature area of the body where the previous  ulcer had been seen did not reveal any ulcer, but did reveal a slightly  depressed area of fibrosis that did represent the previous ulcer that had  healed.  No other ulcers or other abnormalities were noted.  The antrum  likewise appeared normal.  The pylorus was nondeformed and easily allowed  passage of the endoscope into the duodenum.  Both bulb and second portion  were well inspected and appeared to be within normal limits.  The scope was  withdrawn back into the stomach and CLO-test obtained.  The scope was then  withdrawn and the patient returned to the recovery room in stable  condition.  She tolerated the procedure well and there were no immediate complications.   IMPRESSION:  Hiatal hernia with healing of previous gastric ulcer.   PLAN:  Await CLO-test.  Treat for eradication of Helicobacter if positive.                                               John C. Madilyn Fireman, M.D.    JCH/MEDQ  D:  01/09/2004  T:  01/09/2004  Job:  [id number]   cc:   Gregary Signs A. Everardo All, M.D. [facility]

## 2011-05-10 NOTE — Assessment & Plan Note (Signed)
[facility]                              CARDIOLOGY OFFICE NOTE   NAME:CUTTSMalissie, Musgrave                         MRN:          [id number]  DATE:08/05/2006                            DOB:          1916/11/02    Ms. Bristol is a delightful 77 year old lady, who I have followed for many  years.  She presented to [facility] with an acute posterior MI.  It was very  clear that medical therapy was most appropriate.  This was undertaken and  with a prolonged course, she stabilized.  She was admitted on 07/27 and is  being discharged on 08/05/06.   I carefully reviewed whether cardiac catheterization was indicated.  Because  of overall comorbidities and renal dysfunction, I felt it most prudent to  treat her medically and this was done.  She is quite stable.   At the time of discharge, her problems are listed as follows:  1. Coronary disease.  2. History of coronary artery bypass graft in 1999.  3. Left lower nodule followed by Dr. Sherene Sires.  4. History of dizziness.  It is felt she probably does have some      vestibular problems and she received some education about this to help      with her dizziness.  5. Hypertension, stable.  6. Ejection fraction 40% to 45% after her posterior myocardial infarction      here in the hospital.  7. Lipid abnormalities.  8. Peripheral vascular disease followed by Dr. Cari Caraway.  9. History of peptic ulcer disease in the past.  10.Glaucoma.  11.Degenerative joint disease.  12.Right bundle branch block.  13.*Acute posterior myocardial infarction this admission.  The patient was      on dobutamine for a while.  Time was needed for her to stabilize.  She      was wet for a period of time and we pushed her diuresis.  Then over      time, she began to diurese and ultimately her Lasix was stopped      completely and she is on only 1 dose of spironolactone.  14.*Right hand hematoma.  The patient has a significant hematoma on  the      back of her right hand.  In fact, she had ecchymosis throughout her      right arm.  The hematoma remains fluctuant.  However, it is not warm or      red.  The situation was reviewed at great length and it was felt that      it should not be tapped and it is slowly resolving and this will be      watched over time.  15.Renal insufficiency.  Baseline creatinine is in the 1.9 range.  She is      going home today with a BUN of 35 and a creatinine of 2.3.  16.Volume status.  She was clearly wet for a prolonged period of time      after her myocardial infarction, but his now stabilized.  In fact, she      diuresed close  to 1 liter yesterday on no diuretics, other than      spironolactone.  17.Volume status.  I believe that she is stable at this time and does not      need Lasix at home.  18.Potassium.  It has run on the low side despite a small amount of      spironolactone.  She goes home on a small amount of potassium.  19.Hypoxia with her lung disease.  She will need home O2.   She is going to the [employer].  It is possible she  may stabilize and be able to go to her own home soon.   Choice of meds relative to her medical status:  1. Coreg - yes.  2. ACE inhibitor - no because of renal dysfunction.  3. Eplerenone (spironolactone being used).  4. Digoxin - no.  5. Diuretic - spironolactone only.  6. Cholesterol medicine - yes.  7. Aspirin - yes.  8. Potassium 10 mEq daily.  9. Home O2 to be arranged.   I will see her back for early followup in the office.                               Luis Abed, MD, [facility]    JDK/MedQ  DD:  08/05/2006  DT:  08/05/2006  Job #:  [id number]

## 2011-05-10 NOTE — Assessment & Plan Note (Signed)
[facility]                              CARDIOLOGY OFFICE NOTE   NAME:Graham, Bethany MASCARO                       MRN:          [id number]  DATE:09/23/2006                            DOB:          02-28-1916    Bethany Graham is seen for followup.  She is doing remarkably well.  She has been  hospitalized.  She was discharged and had to be rehospitalized.  Since then,  she has been stable.  She was at a nursing home center for her recovery, and  now she is back at her house.  She is doing well.  The patient is not having  any significant chest pain or shortness of breath.  She has had no syncope  or presyncope.  She has some mild GI symptoms.  It appears that having her  on Prilosec twice a day will be better for her.   While in the hospital, she had an MI, and we treated her medically.  She is  doing well with this.  She is not having any recurrent chest pain.   PAST MEDICAL HISTORY:  Other medical problems, see the complete list below.   ALLERGIES:  PENICILLIN, SULFA, CODEINE, ULTRAM, and TETRACYCLINE.   MEDICATIONS:  1. Coreg 6.25 b.i.d.  2. Aspirin 325.  3. Vytorin 10/40.  4. Detrol LA.  5. Tofranil.  6. Potassium 20.  7. Prilosec 20 b.i.d.  8. Darvocet p.r.n.  9. MiraLax p.r.n.  10.Blephamide weekly.  11.Lasix 40 mg daily.   REVIEW OF SYSTEMS:  She is doing well.  She mentions some mild GI symptoms;  however, otherwise, she is feeling well.  She has no GU symptoms.  She does  not mention her vertigo today.  Otherwise, her review of systems is  negative.   PHYSICAL EXAMINATION:  GENERAL:  Patient is with her walker.  She is stable,  sitting on the exam table.  She is oriented to person, time, and place, and  her affect is normal.  VITAL SIGNS:  Blood pressure 118/68 with a pulse of 71.  Weight is 138  pounds.  HEENT:  No xanthelasma.  She has normal extraocular motion.  There is no  carotid bruit today.  No jugular venous distention.  CARDIAC:  Her murmur of mitral regurgitation.  LUNGS:  Scattered rhonchi and a few rales.  We hear this chronically.  She  has no respiratory distress.  ABDOMEN:  Soft.  There are no masses or bruits.  EXTREMITIES:  She has no significant peripheral edema today.  The patient  does have kyphosis.   Her EKG today reveals right bundle branch block.  She has a sinus rhythm.  Her right bundle branch block is old.   Most recent BUN is 38 with a creatinine of 2.5.  She has chronic renal  insufficiency.  Her potassium had normalized.  We will obtain a VMET today.   Problems are listed extensively on my note of September 08, 2006 and  reviewed completely.   PROBLEM LIST:  1. Coronary disease, stable.  3. History of dizziness, stable.  5.  Hypertension, stable.  8. Peripheral vascular disease, followed by Dr. Edilia Bo.  We have postponed     her next follow-up visit for now.  9. History of peptic ulcer disease and some symptoms now.  We will allow     her to take her Pepcid b.i.d.   Renal insufficiency.  We have seen creatinines from 1.9 to 2.6.  BMET will  be checked today.  16.      Volume status.  This is stable.  17.      Potassium.  This is stable and will recheck it.   The patient's calcium recently has been elevated.  She will be seeing Dr.  Everardo All tomorrow.   Patient is stable.  I will see her back on November 1.            ______________________________  Luis Abed, MD, [facility]     JDK/MedQ  DD:  09/23/2006  DT:  09/23/2006  Job #:  [id number]

## 2011-05-10 NOTE — Assessment & Plan Note (Signed)
[facility]                            CARDIOLOGY OFFICE NOTE   NAME:Osmond, ALEX MCMANIGAL                       MRN:          [id number]  DATE:12/29/2006                            DOB:          January 05, 1916    Ms. Botello is seen for followup today and she is doing better.  Please  see my note of November 25, 2006.  Her Lasix was increased.  Her edema is  gone.  She is doing much better.   Past medical history, medications see the flow sheet.   REVIEW OF SYSTEMS:  She mentions a nodule on her right arm.  I think  this is not a major problem.  Otherwise her review of systems is  negative.   PHYSICAL EXAMINATION:  The patient is oriented x3.  Affect is normal.  Blood pressure is 119/71 with a rate of 79.  LUNGS:  Clear.  Respiratory effort is not labored.  HEENT:  She has no xanthelasmas.  She has normal extraocular motion.  SKIN:  The patient does have multiple areas of ecchymoses on her skin.  CARDIAC:  Reveals a systolic murmur of mitral regurgitation.  ABDOMEN:  Soft.  EXTREMITIES:  She has no significant peripheral edema today.   Problems are listed in my note of November 25, 2006.  Number 14, right  hand hematoma:  This is now almost completely gone.  Number 16, volume  status.  Her volume status is stable at this time.  Number 20, new  problem:  Question of a nodule on her right forearm.  I suspect that  this will resolve with time.  No further change in any of her medicines.  Because we have increased her diuretics, we do need a BMET today.     Luis Abed, MD, [facility]  Electronically Signed    JDK/MedQ  DD: 12/29/2006  DT: 12/29/2006  Job #: [id number]

## 2011-05-10 NOTE — Op Note (Signed)
[facility]  Patient:    MYNDI, WAMBLE Visit Number: [id number] MRN: [id number]          Service Type: DSU Location: [facility] Attending Physician:  Rhina Brackett Dictated by:   Duke Salvia. Marcelle Overlie, M.D. Proc. Date: 03/04/02 Admit Date:  03/04/2002                             Operative Report  PREOPERATIVE DIAGNOSIS:       Incomplete abortion.  POSTOPERATIVE DIAGNOSIS:      Incomplete abortion.  OPERATION:                    Dilatation and curettage under MAC                               plus paracervical block.  SURGEON:                      Duke Salvia. Marcelle Overlie, M.D.  ANESTHESIA:                   MAC plus paracervical block.  DESCRIPTION OF PROCEDURE:     This patient was taken to the operating room and after an adequate level of sedation was obtained and with the patients legs in stirrups, the perineum and vagina were prepped and draped in the usual sterile manner for D&C. The bladder was drained and an examination under anesthesia carried out. The uterus was small, mid-positioned and mobile. A speculum was positioned and a paracervical block created by infiltrating at 3:00 and 9:00 submucosally using 5-7 cc of 0.1% Xylocaine on either side after negative aspiration. The uterus could not be sounded to stenosis. Tear duct probes were used from smallest to largest to establish the normal plane of the uterus which was directly midline. Then a very small tapered dilator could be used to dilate the internal os allowing passage of a Pipelle for endometrial sampling, revealing minimal tissue, but adequate for diagnosis. The uterus sounded to 6-7 cm. She tolerated this without difficulty and went to the recovery room in good condition. Dictated by:   Duke Salvia. Marcelle Overlie, M.D. Attending Physician:  Rhina Brackett DD:  03/04/02 TD:  03/05/02 Job: [id number]

## 2011-09-11 LAB — BASIC METABOLIC PANEL
BUN: 55 — ABNORMAL HIGH
BUN: 58 — ABNORMAL HIGH
CO2: 35 — ABNORMAL HIGH
CO2: 37 — ABNORMAL HIGH
Calcium: 8.1 — ABNORMAL LOW
Calcium: 8.1 — ABNORMAL LOW
Chloride: 101
Chloride: 103
Chloride: 99
Creatinine, Ser: 1.28 — ABNORMAL HIGH
Creatinine, Ser: 1.51 — ABNORMAL HIGH
Creatinine, Ser: 1.68 — ABNORMAL HIGH
GFR calc Af Amer: 34 — ABNORMAL LOW
GFR calc Af Amer: 35 — ABNORMAL LOW
GFR calc Af Amer: 48 — ABNORMAL LOW
GFR calc non Af Amer: 28 — ABNORMAL LOW
GFR calc non Af Amer: 39 — ABNORMAL LOW
Glucose, Bld: 110 — ABNORMAL HIGH
Potassium: 3.2 — ABNORMAL LOW
Potassium: 3.4 — ABNORMAL LOW
Sodium: 140

## 2011-09-11 LAB — HEMOGLOBIN AND HEMATOCRIT, BLOOD
HCT: 28.1 — ABNORMAL LOW
HCT: 28.2 — ABNORMAL LOW
Hemoglobin: 9.3 — ABNORMAL LOW
Hemoglobin: 9.3 — ABNORMAL LOW

## 2011-09-11 LAB — PROTIME-INR
INR: 1.5
INR: 1.5
INR: 1.7 — ABNORMAL HIGH
INR: 2.1 — ABNORMAL HIGH
Prothrombin Time: 18.4 — ABNORMAL HIGH
Prothrombin Time: 23.3 — ABNORMAL HIGH
Prothrombin Time: 24.4 — ABNORMAL HIGH

## 2011-09-11 LAB — B-NATRIURETIC PEPTIDE (CONVERTED LAB): Pro B Natriuretic peptide (BNP): 1510 — ABNORMAL HIGH

## 2011-09-12 LAB — COMPREHENSIVE METABOLIC PANEL
ALT: 17
AST: 19
Alkaline Phosphatase: 76
CO2: 29
Calcium: 8.1 — ABNORMAL LOW
Chloride: 106
GFR calc Af Amer: 31 — ABNORMAL LOW
GFR calc non Af Amer: 26 — ABNORMAL LOW
Potassium: 3.6
Sodium: 144

## 2011-09-12 LAB — CBC
HCT: 26 — ABNORMAL LOW
HCT: 27.8 — ABNORMAL LOW
HCT: 28 — ABNORMAL LOW
Hemoglobin: 8.3 — ABNORMAL LOW
Hemoglobin: 9.3 — ABNORMAL LOW
Hemoglobin: 9.3 — ABNORMAL LOW
MCHC: 33.5
MCV: 83.9
MCV: 84.4
Platelets: 168
Platelets: 232
RBC: 2.94 — ABNORMAL LOW
RBC: 3.23 — ABNORMAL LOW
RBC: 3.28 — ABNORMAL LOW
RBC: 3.33 — ABNORMAL LOW
RBC: 3.39 — ABNORMAL LOW
RDW: 19.7 — ABNORMAL HIGH
WBC: 11.6 — ABNORMAL HIGH
WBC: 12.7 — ABNORMAL HIGH
WBC: 15.3 — ABNORMAL HIGH
WBC: 18.9 — ABNORMAL HIGH

## 2011-09-12 LAB — PROTIME-INR
INR: 2.2 — ABNORMAL HIGH
INR: 5.3
Prothrombin Time: 22.2 — ABNORMAL HIGH
Prothrombin Time: 25.4 — ABNORMAL HIGH
Prothrombin Time: 29.7 — ABNORMAL HIGH
Prothrombin Time: 51 — ABNORMAL HIGH

## 2011-09-12 LAB — DIFFERENTIAL
Basophils Absolute: 0
Lymphs Abs: 0.1 — ABNORMAL LOW
Monocytes Relative: 4
Neutro Abs: 12.1 — ABNORMAL HIGH

## 2011-09-12 LAB — BASIC METABOLIC PANEL
BUN: 77 — ABNORMAL HIGH
BUN: 88 — ABNORMAL HIGH
BUN: 97 — ABNORMAL HIGH
CO2: 20
CO2: 20
Calcium: 7.6 — ABNORMAL LOW
Chloride: 103
Chloride: 111
Chloride: 97
Chloride: 98
Creatinine, Ser: 1.66 — ABNORMAL HIGH
Creatinine, Ser: 4.34 — ABNORMAL HIGH
GFR calc Af Amer: 12 — ABNORMAL LOW
GFR calc Af Amer: 13 — ABNORMAL LOW
GFR calc Af Amer: 35 — ABNORMAL LOW
GFR calc non Af Amer: 11 — ABNORMAL LOW
GFR calc non Af Amer: 17 — ABNORMAL LOW
GFR calc non Af Amer: 29 — ABNORMAL LOW
Glucose, Bld: 158 — ABNORMAL HIGH
Glucose, Bld: 98
Potassium: 3.5
Potassium: 4
Potassium: 4.8
Potassium: 5.4 — ABNORMAL HIGH
Sodium: 132 — ABNORMAL LOW
Sodium: 133 — ABNORMAL LOW

## 2011-09-12 LAB — CULTURE, BLOOD (ROUTINE X 2)

## 2011-09-12 LAB — URINALYSIS, ROUTINE W REFLEX MICROSCOPIC
Bilirubin Urine: NEGATIVE
Glucose, UA: NEGATIVE
Ketones, ur: NEGATIVE
Protein, ur: NEGATIVE
pH: 6

## 2011-09-12 LAB — SEDIMENTATION RATE: Sed Rate: 11

## 2011-09-12 LAB — POCT CARDIAC MARKERS: Troponin i, poc: <0.05

## 2011-09-12 LAB — URINE CULTURE: Colony Count: >=100000

## 2011-09-12 LAB — LIPID PANEL
Cholesterol: 164
HDL: 59
Total CHOL/HDL Ratio: 2.8
Triglycerides: 90

## 2011-09-12 LAB — URINE MICROSCOPIC-ADD ON

## 2011-09-12 LAB — MAGNESIUM: Magnesium: 3.1 — ABNORMAL HIGH

## 2011-09-12 LAB — C-REACTIVE PROTEIN: CRP: 22 — ABNORMAL HIGH (ref <0.6)

## 2011-09-12 LAB — PREPARE FRESH FROZEN PLASMA

## 2011-09-27 LAB — BASIC METABOLIC PANEL
BUN: 73 — ABNORMAL HIGH
BUN: 96 — ABNORMAL HIGH
BUN: 97 — ABNORMAL HIGH
CO2: 23
CO2: 26
CO2: 27
CO2: 27
CO2: 28
Calcium: 8 — ABNORMAL LOW
Calcium: 8.2 — ABNORMAL LOW
Calcium: 8.3 — ABNORMAL LOW
Chloride: 106
Chloride: 106
Chloride: 99
Creatinine, Ser: 1.6 — ABNORMAL HIGH
Creatinine, Ser: 2.48 — ABNORMAL HIGH
Creatinine, Ser: 3.22 — ABNORMAL HIGH
Creatinine, Ser: 3.59 — ABNORMAL HIGH
GFR calc Af Amer: 37 — ABNORMAL LOW
GFR calc Af Amer: 38 — ABNORMAL LOW
GFR calc Af Amer: 41 — ABNORMAL LOW
GFR calc non Af Amer: 12 — ABNORMAL LOW
GFR calc non Af Amer: 18 — ABNORMAL LOW
GFR calc non Af Amer: 31 — ABNORMAL LOW
GFR calc non Af Amer: 34 — ABNORMAL LOW
Glucose, Bld: 100 — ABNORMAL HIGH
Glucose, Bld: 102 — ABNORMAL HIGH
Glucose, Bld: 126 — ABNORMAL HIGH
Glucose, Bld: 136 — ABNORMAL HIGH
Potassium: 4.2
Potassium: 4.5
Potassium: 5.6 — ABNORMAL HIGH
Sodium: 137
Sodium: 140
Sodium: 140

## 2011-09-27 LAB — CBC
HCT: 26.6 — ABNORMAL LOW
HCT: 27.7 — ABNORMAL LOW
HCT: 31.4 — ABNORMAL LOW
Hemoglobin: 10.6 — ABNORMAL LOW
Hemoglobin: 9.5 — ABNORMAL LOW
MCHC: 32.8
MCHC: 33.2
MCV: 86.9
MCV: 87.3
Platelets: 238
Platelets: 241
Platelets: 252
RBC: 3.25 — ABNORMAL LOW
RBC: 3.61 — ABNORMAL LOW
RDW: 15.8 — ABNORMAL HIGH
RDW: 16 — ABNORMAL HIGH
RDW: 16 — ABNORMAL HIGH

## 2011-09-27 LAB — PROTIME-INR
INR: 2 — ABNORMAL HIGH
INR: 2.4 — ABNORMAL HIGH
INR: 2.5 — ABNORMAL HIGH
INR: 2.7 — ABNORMAL HIGH
INR: 4.1 — ABNORMAL HIGH
Prothrombin Time: 23.6 — ABNORMAL HIGH
Prothrombin Time: 28 — ABNORMAL HIGH
Prothrombin Time: 28.2 — ABNORMAL HIGH
Prothrombin Time: 29.4 — ABNORMAL HIGH
Prothrombin Time: 30.4 — ABNORMAL HIGH
Prothrombin Time: 37.8 — ABNORMAL HIGH
Prothrombin Time: 37.9 — ABNORMAL HIGH
Prothrombin Time: 41.5 — ABNORMAL HIGH

## 2011-09-27 LAB — DIFFERENTIAL
Basophils Relative: 1
Eosinophils Absolute: 0.3
Eosinophils Relative: 4
Lymphs Abs: 0.8
Monocytes Absolute: 1.1 — ABNORMAL HIGH
Monocytes Relative: 14 — ABNORMAL HIGH
Neutrophils Relative %: 70

## 2011-09-27 LAB — OCCULT BLOOD X 1 CARD TO LAB, STOOL: Fecal Occult Bld: POSITIVE

## 2011-09-27 LAB — B-NATRIURETIC PEPTIDE (CONVERTED LAB): Pro B Natriuretic peptide (BNP): 1470 — ABNORMAL HIGH

## 2011-10-01 LAB — PROTIME-INR
INR: 2.3 — ABNORMAL HIGH
Prothrombin Time: 25.6 — ABNORMAL HIGH

## 2011-10-04 LAB — CBC
HCT: 40.1
HCT: 37.8
Hemoglobin: 13.1
Hemoglobin: 13.7
Hemoglobin: 12.1
MCHC: 33.1
MCHC: 33.4
MCHC: 33.6
MCHC: 33
MCHC: 33.3
MCHC: 33.6
MCV: 92.1
MCV: 91.5
MCV: 91.9
MCV: 92.7
MCV: 93
Platelets: 318
Platelets: 325
Platelets: 328
Platelets: 336
Platelets: 332
Platelets: 334
Platelets: 338
RBC: 3.92
RBC: 4.26
RDW: 15.1 — ABNORMAL HIGH
RDW: 16 — ABNORMAL HIGH
RDW: 16.5 — ABNORMAL HIGH
RDW: 16.6 — ABNORMAL HIGH
RDW: 16.9 — ABNORMAL HIGH
RDW: 17 — ABNORMAL HIGH
WBC: 5.9
WBC: 6.2
WBC: 5.9
WBC: 6.4

## 2011-10-04 LAB — URINALYSIS, ROUTINE W REFLEX MICROSCOPIC
Bilirubin Urine: NEGATIVE
Bilirubin Urine: NEGATIVE
Hgb urine dipstick: NEGATIVE
Ketones, ur: NEGATIVE
Ketones, ur: NEGATIVE
Nitrite: NEGATIVE
Nitrite: NEGATIVE
Protein, ur: NEGATIVE
Protein, ur: NEGATIVE
Urobilinogen, UA: 0.2

## 2011-10-04 LAB — LIPID PANEL
Cholesterol: 325 — ABNORMAL HIGH
HDL: 56
LDL Cholesterol: 235 — ABNORMAL HIGH
Total CHOL/HDL Ratio: 5.8
Triglycerides: 170 — ABNORMAL HIGH

## 2011-10-04 LAB — DIFFERENTIAL
Basophils Absolute: 0.1
Basophils Relative: 1
Lymphocytes Relative: 19
Neutro Abs: 5.5
Neutrophils Relative %: 67

## 2011-10-04 LAB — COMPREHENSIVE METABOLIC PANEL
AST: 14
BUN: 27 — ABNORMAL HIGH
CO2: 30
Calcium: 8.7
Chloride: 105
Creatinine, Ser: 1.24 — ABNORMAL HIGH
GFR calc Af Amer: 49 — ABNORMAL LOW
GFR calc non Af Amer: 41 — ABNORMAL LOW
Total Bilirubin: 0.5

## 2011-10-04 LAB — BASIC METABOLIC PANEL
BUN: 28 — ABNORMAL HIGH
Calcium: 9.1
Creatinine, Ser: 1.3 — ABNORMAL HIGH
GFR calc non Af Amer: 39 — ABNORMAL LOW
Glucose, Bld: 116 — ABNORMAL HIGH
Sodium: 139

## 2011-10-04 LAB — APTT
aPTT: 80 — ABNORMAL HIGH
aPTT: >200

## 2011-10-04 LAB — PROTIME-INR
INR: 1.2
INR: 3.8 — ABNORMAL HIGH
INR: 0.9
INR: 1
INR: 1.1
Prothrombin Time: 13.9
Prothrombin Time: 15.9 — ABNORMAL HIGH
Prothrombin Time: 22.8 — ABNORMAL HIGH
Prothrombin Time: 39 — ABNORMAL HIGH
Prothrombin Time: 13
Prothrombin Time: 14.7

## 2011-10-04 LAB — CREATININE, SERUM
GFR calc Af Amer: 48 — ABNORMAL LOW
GFR calc non Af Amer: 39 — ABNORMAL LOW

## 2011-10-04 LAB — HEPARIN LEVEL (UNFRACTIONATED): Heparin Unfractionated: 0.43

## 2011-10-10 LAB — B-NATRIURETIC PEPTIDE (CONVERTED LAB): Pro B Natriuretic peptide (BNP): 1270 — ABNORMAL HIGH

## 2011-10-10 LAB — COMPREHENSIVE METABOLIC PANEL
ALT: 10
Albumin: 2.7 — ABNORMAL LOW
Alkaline Phosphatase: 109
Chloride: 105
Potassium: 4.1
Sodium: 137
Total Bilirubin: 0.5
Total Protein: 5.6 — ABNORMAL LOW

## 2011-10-10 LAB — CROSSMATCH: Antibody Screen: NEGATIVE

## 2011-10-10 LAB — DIFFERENTIAL
Basophils Relative: 0
Eosinophils Absolute: 0.3
Eosinophils Relative: 4
Monocytes Absolute: 0.6
Monocytes Relative: 8

## 2011-10-10 LAB — BASIC METABOLIC PANEL
BUN: 23
BUN: 25 — ABNORMAL HIGH
BUN: 27 — ABNORMAL HIGH
BUN: 30 — ABNORMAL HIGH
BUN: 33 — ABNORMAL HIGH
CO2: 27
CO2: 31
CO2: 32
Calcium: 8.1 — ABNORMAL LOW
Calcium: 8.2 — ABNORMAL LOW
Calcium: 8.4
Calcium: 8.6
Chloride: 101
Chloride: 102
Chloride: 103
Chloride: 99
Creatinine, Ser: 1.49 — ABNORMAL HIGH
Creatinine, Ser: 1.88 — ABNORMAL HIGH
GFR calc Af Amer: 31 — ABNORMAL LOW
GFR calc Af Amer: 40 — ABNORMAL LOW
GFR calc Af Amer: 44 — ABNORMAL LOW
GFR calc non Af Amer: 27 — ABNORMAL LOW
GFR calc non Af Amer: 28 — ABNORMAL LOW
GFR calc non Af Amer: 33 — ABNORMAL LOW
GFR calc non Af Amer: 35 — ABNORMAL LOW
GFR calc non Af Amer: 35 — ABNORMAL LOW
GFR calc non Af Amer: 37 — ABNORMAL LOW
Glucose, Bld: 167 — ABNORMAL HIGH
Glucose, Bld: 81
Glucose, Bld: 83
Glucose, Bld: 87
Glucose, Bld: 89
Potassium: 3.5
Potassium: 3.6
Potassium: 3.9
Potassium: 4
Potassium: 4
Potassium: 4.8
Sodium: 134 — ABNORMAL LOW
Sodium: 137
Sodium: 139
Sodium: 142

## 2011-10-10 LAB — CBC
Hemoglobin: 10.2 — ABNORMAL LOW
Hemoglobin: 11.5 — ABNORMAL LOW
MCHC: 32.6
MCV: 87.5
Platelets: 179
Platelets: 263
RBC: 3.05 — ABNORMAL LOW
RDW: 16.6 — ABNORMAL HIGH
RDW: 17.7 — ABNORMAL HIGH
WBC: 7.1
WBC: 7.4
WBC: 8.6

## 2011-10-10 LAB — PROTIME-INR
INR: 1
INR: 1.2
INR: 1.2
INR: 1.9 — ABNORMAL HIGH
Prothrombin Time: 15.8 — ABNORMAL HIGH
Prothrombin Time: 18.7 — ABNORMAL HIGH
Prothrombin Time: 22.6 — ABNORMAL HIGH

## 2011-10-10 LAB — URINE MICROSCOPIC-ADD ON

## 2011-10-10 LAB — HEMOGLOBIN AND HEMATOCRIT, BLOOD
Hemoglobin: 11.1 — ABNORMAL LOW
Hemoglobin: 8 — ABNORMAL LOW

## 2011-10-10 LAB — URINALYSIS, ROUTINE W REFLEX MICROSCOPIC
Bilirubin Urine: NEGATIVE
Glucose, UA: NEGATIVE
Ketones, ur: NEGATIVE
Nitrite: POSITIVE — AB
Specific Gravity, Urine: 1.013
pH: 7

## 2011-10-10 LAB — URINE CULTURE
Colony Count: >=100000
Special Requests: NEGATIVE

## 2011-10-10 LAB — ABO/RH: ABO/RH(D): A NEG

## 2014-09-02 ENCOUNTER — Telehealth: Payer: Self-pay | Admitting: *Deleted

## 2014-10-03 NOTE — Telephone Encounter (Signed)
NO ENTRY
